# Patient Record
Sex: Female | Born: 1980 | Race: Black or African American | Hispanic: No | Marital: Single | Smoking: Current every day smoker
Health system: Southern US, Community
[De-identification: ages and names within clinical notes are randomized; demographics above are authoritative.]

## PROBLEM LIST (undated history)

## (undated) DIAGNOSIS — Z789 Other specified health status: Secondary | ICD-10-CM

## (undated) HISTORY — PX: TUBAL LIGATION: SHX77

## (undated) HISTORY — PX: KNEE SURGERY: SHX244

## (undated) HISTORY — PX: HAND SURGERY: SHX662

---

## 2005-11-30 ENCOUNTER — Emergency Department (HOSPITAL_COMMUNITY): Admission: EM | Admit: 2005-11-30 | Discharge: 2005-11-30 | Payer: Self-pay | Admitting: *Deleted

## 2005-12-19 ENCOUNTER — Emergency Department (HOSPITAL_COMMUNITY): Admission: EM | Admit: 2005-12-19 | Discharge: 2005-12-19 | Payer: Self-pay | Admitting: Emergency Medicine

## 2006-03-26 ENCOUNTER — Emergency Department (HOSPITAL_COMMUNITY): Admission: EM | Admit: 2006-03-26 | Discharge: 2006-03-26 | Payer: Self-pay | Admitting: Emergency Medicine

## 2006-08-01 ENCOUNTER — Emergency Department (HOSPITAL_COMMUNITY): Admission: EM | Admit: 2006-08-01 | Discharge: 2006-08-01 | Payer: Self-pay | Admitting: Emergency Medicine

## 2007-02-07 ENCOUNTER — Emergency Department (HOSPITAL_COMMUNITY): Admission: EM | Admit: 2007-02-07 | Discharge: 2007-02-07 | Payer: Self-pay | Admitting: Emergency Medicine

## 2007-07-01 ENCOUNTER — Emergency Department (HOSPITAL_COMMUNITY): Admission: EM | Admit: 2007-07-01 | Discharge: 2007-07-01 | Payer: Self-pay | Admitting: Emergency Medicine

## 2008-07-06 ENCOUNTER — Emergency Department (HOSPITAL_COMMUNITY): Admission: EM | Admit: 2008-07-06 | Discharge: 2008-07-06 | Payer: Self-pay | Admitting: Emergency Medicine

## 2008-07-17 ENCOUNTER — Emergency Department (HOSPITAL_COMMUNITY): Admission: EM | Admit: 2008-07-17 | Discharge: 2008-07-17 | Payer: Self-pay | Admitting: Emergency Medicine

## 2008-10-09 ENCOUNTER — Emergency Department (HOSPITAL_COMMUNITY): Admission: EM | Admit: 2008-10-09 | Discharge: 2008-10-09 | Payer: Self-pay | Admitting: Emergency Medicine

## 2009-10-16 ENCOUNTER — Emergency Department (HOSPITAL_COMMUNITY): Admission: EM | Admit: 2009-10-16 | Discharge: 2009-10-16 | Payer: Self-pay | Admitting: Emergency Medicine

## 2010-03-29 ENCOUNTER — Emergency Department (HOSPITAL_COMMUNITY)
Admission: EM | Admit: 2010-03-29 | Discharge: 2010-03-30 | Disposition: A | Discharge status: Home / Self Care | Payer: Medicaid Other | Attending: Emergency Medicine | Admitting: Emergency Medicine

## 2010-03-29 DIAGNOSIS — L03319 Cellulitis of trunk, unspecified: Secondary | ICD-10-CM | POA: Insufficient documentation

## 2010-03-29 DIAGNOSIS — L02219 Cutaneous abscess of trunk, unspecified: Secondary | ICD-10-CM | POA: Insufficient documentation

## 2010-04-01 ENCOUNTER — Emergency Department (HOSPITAL_COMMUNITY)
Admission: EM | Admit: 2010-04-01 | Discharge: 2010-04-01 | Disposition: A | Discharge status: Home / Self Care | Payer: Medicaid Other | Attending: Emergency Medicine | Admitting: Emergency Medicine

## 2010-04-01 DIAGNOSIS — L02219 Cutaneous abscess of trunk, unspecified: Secondary | ICD-10-CM | POA: Insufficient documentation

## 2010-04-01 DIAGNOSIS — L03319 Cellulitis of trunk, unspecified: Secondary | ICD-10-CM | POA: Insufficient documentation

## 2010-04-01 DIAGNOSIS — Z4800 Encounter for change or removal of nonsurgical wound dressing: Secondary | ICD-10-CM | POA: Insufficient documentation

## 2010-04-16 ENCOUNTER — Emergency Department (HOSPITAL_COMMUNITY)
Admission: EM | Admit: 2010-04-16 | Discharge: 2010-04-16 | Disposition: A | Discharge status: Home / Self Care | Payer: Medicaid Other | Attending: Emergency Medicine | Admitting: Emergency Medicine

## 2010-04-16 DIAGNOSIS — R070 Pain in throat: Secondary | ICD-10-CM | POA: Insufficient documentation

## 2010-04-16 DIAGNOSIS — R1310 Dysphagia, unspecified: Secondary | ICD-10-CM | POA: Insufficient documentation

## 2010-04-16 DIAGNOSIS — J020 Streptococcal pharyngitis: Secondary | ICD-10-CM | POA: Insufficient documentation

## 2010-04-16 DIAGNOSIS — R059 Cough, unspecified: Secondary | ICD-10-CM | POA: Insufficient documentation

## 2010-04-16 DIAGNOSIS — R05 Cough: Secondary | ICD-10-CM | POA: Insufficient documentation

## 2010-04-16 DIAGNOSIS — IMO0001 Reserved for inherently not codable concepts without codable children: Secondary | ICD-10-CM | POA: Insufficient documentation

## 2010-04-16 DIAGNOSIS — J351 Hypertrophy of tonsils: Secondary | ICD-10-CM | POA: Insufficient documentation

## 2010-04-16 LAB — RAPID STREP SCREEN (MED CTR MEBANE ONLY): Streptococcus, Group A Screen (Direct): POSITIVE — AB

## 2010-07-12 ENCOUNTER — Emergency Department (HOSPITAL_COMMUNITY)
Admission: EM | Admit: 2010-07-12 | Discharge: 2010-07-12 | Disposition: A | Discharge status: Home / Self Care | Payer: Medicaid Other | Attending: Emergency Medicine | Admitting: Emergency Medicine

## 2010-07-12 ENCOUNTER — Emergency Department (HOSPITAL_COMMUNITY): Payer: Medicaid Other

## 2010-07-12 DIAGNOSIS — X500XXA Overexertion from strenuous movement or load, initial encounter: Secondary | ICD-10-CM | POA: Insufficient documentation

## 2010-07-12 DIAGNOSIS — M545 Low back pain, unspecified: Secondary | ICD-10-CM | POA: Insufficient documentation

## 2010-07-12 DIAGNOSIS — S335XXA Sprain of ligaments of lumbar spine, initial encounter: Secondary | ICD-10-CM | POA: Insufficient documentation

## 2010-10-08 ENCOUNTER — Emergency Department (HOSPITAL_COMMUNITY)
Admission: EM | Admit: 2010-10-08 | Discharge: 2010-10-08 | Disposition: A | Discharge status: Home / Self Care | Payer: Medicaid Other | Attending: Emergency Medicine | Admitting: Emergency Medicine

## 2010-10-08 ENCOUNTER — Emergency Department (HOSPITAL_COMMUNITY): Payer: Medicaid Other

## 2010-10-08 DIAGNOSIS — X500XXA Overexertion from strenuous movement or load, initial encounter: Secondary | ICD-10-CM | POA: Insufficient documentation

## 2010-10-08 DIAGNOSIS — IMO0002 Reserved for concepts with insufficient information to code with codable children: Secondary | ICD-10-CM | POA: Insufficient documentation

## 2010-10-08 DIAGNOSIS — M79609 Pain in unspecified limb: Secondary | ICD-10-CM | POA: Insufficient documentation

## 2010-10-08 DIAGNOSIS — M25559 Pain in unspecified hip: Secondary | ICD-10-CM | POA: Insufficient documentation

## 2011-03-20 ENCOUNTER — Encounter (HOSPITAL_COMMUNITY): Payer: Self-pay | Admitting: Emergency Medicine

## 2011-03-20 ENCOUNTER — Emergency Department (HOSPITAL_COMMUNITY)
Admission: EM | Admit: 2011-03-20 | Discharge: 2011-03-20 | Disposition: A | Discharge status: Home / Self Care | Payer: Medicaid Other | Attending: Emergency Medicine | Admitting: Emergency Medicine

## 2011-03-20 DIAGNOSIS — H5710 Ocular pain, unspecified eye: Secondary | ICD-10-CM | POA: Insufficient documentation

## 2011-03-20 DIAGNOSIS — H579 Unspecified disorder of eye and adnexa: Secondary | ICD-10-CM | POA: Insufficient documentation

## 2011-03-20 DIAGNOSIS — H109 Unspecified conjunctivitis: Secondary | ICD-10-CM

## 2011-03-20 DIAGNOSIS — H5789 Other specified disorders of eye and adnexa: Secondary | ICD-10-CM | POA: Insufficient documentation

## 2011-03-20 MED ORDER — FLUORESCEIN SODIUM 1 MG OP STRP: 1.0000 | ORAL_STRIP | Freq: Once | OPHTHALMIC | Status: DC

## 2011-03-20 MED ORDER — TETRACAINE HCL 0.5 % OP SOLN
1.0000 [drp] | Freq: Once | OPHTHALMIC | Status: DC
  Filled 2011-03-20: qty 2

## 2011-03-20 MED ORDER — NAPHAZOLINE-PHENIRAMINE 0.025-0.3 % OP SOLN: 1.0000 [drp] | OPHTHALMIC | Status: AC | PRN

## 2011-03-20 NOTE — ED Provider Notes (Signed)
Medical screening examination/treatment/procedure(s) were performed by non-physician practitioner and as supervising physician I was immediately available for consultation/collaboration.  Ethelda Chick, MD 03/20/11 1053

## 2011-03-20 NOTE — ED Provider Notes (Signed)
History     CSN: [id number]  Arrival date & time 03/20/11  5621   First MD Initiated Contact with Patient 03/20/11 0920      Chief Complaint  Patient presents with  . Eye Pain    (Consider location/radiation/quality/duration/timing/severity/associated sxs/prior treatment) HPI Comments: Patient presents emergency department with chief complaint of right eye your dictation.  Patient states that it has been itching and tearing for the last 2 days out of the right eye.  States she has a sensation of foreign body. She denies blurred vision, diplopia, pain with extraocular movements, thick purulent discharge, headaches, photophobia, recent sickness, fevers, night sweats, chills.  Patient currently has no other complaints.  Patient is a 31 y.o. female presenting with eye pain. The history is provided by the patient.  Eye Pain Pertinent negatives include no abdominal pain, chest pain, chills, congestion, fever, headaches, neck pain, numbness or weakness.    History reviewed. No pertinent past medical history.  History reviewed. No pertinent past surgical history.  History reviewed. No pertinent family history.  History  Substance Use Topics  . Smoking status: Current Everyday Smoker  . Smokeless tobacco: Not on file  . Alcohol Use: Yes     occasional    OB History    Grav Para Term Preterm Abortions TAB SAB Ect Mult Living                  Review of Systems  Constitutional: Negative for fever, chills and appetite change.  HENT: Negative for congestion, neck pain and neck stiffness.   Eyes: Positive for redness and itching. Negative for photophobia, pain and visual disturbance.  Respiratory: Negative for shortness of breath.   Cardiovascular: Negative for chest pain and leg swelling.  Gastrointestinal: Negative for abdominal pain.  Genitourinary: Negative for dysuria, urgency and frequency.  Neurological: Negative for dizziness, syncope, weakness, light-headedness, numbness  and headaches.  Psychiatric/Behavioral: Negative for confusion.  All other systems reviewed and are negative.    Allergies  Review of patient's allergies indicates no known allergies.  Home Medications  No current outpatient prescriptions on file.  BP 123/66  Pulse 92  Temp(Src) 98.2 F (36.8 C) (Oral)  Resp 18  SpO2 99%  Physical Exam  Nursing note and vitals reviewed. Constitutional: She is oriented to person, place, and time. She appears well-developed and well-nourished. No distress.  HENT:  Head: Normocephalic and atraumatic.  Eyes: Conjunctivae and EOM are normal. Pupils are equal, round, and reactive to light. No foreign body present in the left eye.       No tenderness to palpation over temporal arteries or orbital region. Pain free EOMs, visual acuity equal bilaterally, no increase in IOPs, no proptosis, lid swelling, hyphema, purulent discharge from eyes, or consensual photophobia.  Eyelids everted, no evidence of FB.  Fluorescein study: no corneal uptake, dendritic pattern, or evidence of corneal abrasions.  Neck: Normal range of motion. Neck supple.  Pulmonary/Chest: Effort normal.  Neurological: She is alert and oriented to person, place, and time.  Skin: Skin is warm and dry. No rash noted. She is not diaphoretic.  Psychiatric: Her behavior is normal.    ED Course  Procedures (including critical care time)  Labs Reviewed - No data to display No results found.   No diagnosis found.    MDM  Viral conjunctivitis  Pt dx likely viral conjunctivitis based on presentation & eye exam, no antibiotics are indicated.  No evidence of HSV or VSV infection. Pt is not a  contact lens wearer.  Exam non-concerning for orbital cellulitis, hyphema, corneal ulcers, corneal abrasions or trauma.  Patient will be discharged home with naphazoline drops every one to 2 hours for pruritus.  Patient has been instructed to use cool compresses and practice personal hygiene with  frequent handwashing.  Patient understands to followup with ophthalmology, especially if new symptoms including change in vision, purulent drainage, or entrapment occur.          Jaci Carrel, [place] 03/20/11 1016

## 2011-03-20 NOTE — ED Notes (Signed)
Patient states that she has pain in her right eye with redness and itching x 2 days and states the left eye is starting to itch in the outer corner of the eye. Right eye does appear to be red and irritated.

## 2011-03-20 NOTE — ED Notes (Signed)
Pt c/o right eye redness, itching and watering x 2 days

## 2011-05-01 ENCOUNTER — Emergency Department (HOSPITAL_COMMUNITY)
Admission: EM | Admit: 2011-05-01 | Discharge: 2011-05-01 | Disposition: A | Discharge status: Home / Self Care | Payer: Medicaid Other | Attending: Emergency Medicine | Admitting: Emergency Medicine

## 2011-05-01 ENCOUNTER — Encounter (HOSPITAL_COMMUNITY): Payer: Self-pay | Admitting: *Deleted

## 2011-05-01 DIAGNOSIS — F17200 Nicotine dependence, unspecified, uncomplicated: Secondary | ICD-10-CM | POA: Insufficient documentation

## 2011-05-01 DIAGNOSIS — J040 Acute laryngitis: Secondary | ICD-10-CM | POA: Insufficient documentation

## 2011-05-01 MED ORDER — DEXAMETHASONE 6 MG PO TABS
10.0000 mg | ORAL_TABLET | ORAL | Status: AC
  Administered 2011-05-01: 10 mg via ORAL
  Filled 2011-05-01: qty 1

## 2011-05-01 MED ORDER — DEXAMETHASONE SODIUM PHOSPHATE 10 MG/ML IJ SOLN
INTRAMUSCULAR | Status: AC
  Filled 2011-05-01: qty 1

## 2011-05-01 NOTE — ED Provider Notes (Signed)
History     CSN: [id number]  Arrival date & time 05/01/11  1742   None     Chief Complaint  Patient presents with  . Sore Throat    (Consider location/radiation/quality/duration/timing/severity/associated sxs/prior treatment) HPI Cc hoarse throat, no sore throat. Onset this am, aggravated with talking.  No other sx's History reviewed. No pertinent past medical history.  History reviewed. No pertinent past surgical history.  No family history on file.  History  Substance Use Topics  . Smoking status: Current Everyday Smoker  . Smokeless tobacco: Not on file  . Alcohol Use: Yes     occasional    OB History    Grav Para Term Preterm Abortions TAB SAB Ect Mult Living                  Review of Systems  Constitutional: Negative for fever.  HENT: Positive for voice change. Negative for sore throat and trouble swallowing.   Respiratory: Negative for shortness of breath.   All other systems reviewed and are negative.    Allergies  Review of patient's allergies indicates no known allergies.  Home Medications  No current outpatient prescriptions on file.  BP 103/72  Pulse 98  Temp(Src) 97.4 F (36.3 C) (Oral)  Resp 18  SpO2 98%ra wnl  Physical Exam  Nursing note and vitals reviewed. Constitutional: She appears well-developed and well-nourished.  HENT:  Head: Normocephalic and atraumatic.  Mouth/Throat: Uvula is midline, oropharynx is clear and moist and mucous membranes are normal.  Eyes: Right eye exhibits no discharge. Left eye exhibits no discharge.  Neck: Normal range of motion. Neck supple.  Cardiovascular: Normal rate, regular rhythm and normal heart sounds.   Pulmonary/Chest: Effort normal and breath sounds normal.  Abdominal: Soft. There is no tenderness.  Lymphadenopathy:    She has no cervical adenopathy.  Skin: Skin is warm and dry.    ED Course  Procedures (including critical care time)   Labs Reviewed  RAPID STREP SCREEN   No results  found.   1. Laryngitis acute       MDM  Pt is in nad, afvss, nontoxic appearing, exam and hx as above. Oropharynx clear, no pain.  Likely simple laryngitis.  No cp, doubt aortic aneurysm in young healthy person without hx of medical problems.        Elijio Miles, MD 05/01/11 [phone]

## 2011-05-01 NOTE — ED Notes (Signed)
Pt angry at the wait time.  Reassured.  Pt refused BP-states that it hurts her hand when it squeezes.

## 2011-05-01 NOTE — ED Provider Notes (Signed)
31 year old female had onset day of losing her voice. She has had some nasal congestion with some slight postnasal drainage and cough productive of sputum which she swallows. She denies fever or chills. On exam, there is minimal tenderness to palpation over the left maxillary sinus. Some slight mucoid drainage is noted on the turbinates which does not appear purulent. Thanks is mildly erythematous. There is no cervical adenopathy. She has no difficulty with her secretions. She will be treated with oral steroids but advised to return if symptoms worsen. She is advised to stop smoking.  Dione Booze, MD 05/01/11 2231

## 2011-05-01 NOTE — Discharge Instructions (Signed)
Laryngitis At the top of your windpipe is your voice box. It is the source of your voice. Inside your voice box are 2 bands of muscles called vocal cords. When you breathe, your vocal cords are relaxed and open so that air can get into the lungs. When you decide to say something, these cords come together and vibrate. The sound from these vibrations goes into your throat and comes out through your mouth as sound. Laryngitis is an inflammation of the vocal cords that causes hoarseness, cough, loss of voice, sore throat, and dry throat. Laryngitis can be temporary (acute) or long-term (chronic). Most cases of acute laryngitis improve with time.Chronic laryngitis lasts for more than 3 weeks. CAUSES Laryngitis can often be related to excessive smoking, talking, or yelling, as well as inhalation of toxic fumes and allergies. Acute laryngitis is usually caused by a viral infection, vocal strain, measles or mumps, or bacterial infections. Chronic laryngitis is usually caused by vocal cord strain, vocal cord injury, postnasal drip, growths on the vocal cords, or acid reflux. SYMPTOMS   Cough.   Sore throat.   Dry throat.  RISK FACTORS  Respiratory infections.   Exposure to irritating substances, such as cigarette smoke, excessive amounts of alcohol, stomach acids, and workplace chemicals.   Voice trauma, such as vocal cord injury from shouting or speaking too loud.  DIAGNOSIS  Your cargiver will perform a physical exam. During the physical exam, your caregiver will examine your throat. The most common sign of laryngitis is hoarseness. Laryngoscopy may be necessary to confirm the diagnosis of this condition. This procedure allows your caregiver to look into the larynx. HOME CARE INSTRUCTIONS  Drink enough fluids to keep your urine clear or pale yellow.   Rest until you no longer have symptoms or as directed by your caregiver.   Breathe in moist air.   Take all medicine as directed by your  caregiver.   Do not smoke.   Talk as little as possible (this includes whispering).   Write on paper instead of talking until your voice is back to normal.   Follow up with your caregiver if your condition has not improved after 10 days.  SEEK MEDICAL CARE IF:   You have trouble breathing.   You cough up blood.   You have persistent fever.   You have increasing pain.   You have difficulty swallowing.  MAKE SURE YOU:  Understand these instructions.   Will watch your condition.   Will get help right away if you are not doing well or get worse.  Document Released: 01/27/2005 Document Revised: 01/16/2011 Document Reviewed: 04/04/2010 [facility] Patient Information 2012 [place], [place].  Return for any new or worsening symptoms or any other concerns.

## 2011-05-01 NOTE — ED Notes (Signed)
Pt woke up this am and had lost her voice.  No previous illness, states that she has had some coughing this am and states that her throat is a little sore when she whispers a lot and after coughing.  No fever or chills

## 2011-05-07 NOTE — ED Provider Notes (Signed)
I saw and evaluated the patient, reviewed the resident's note and I agree with the findings and plan.   Dione Booze, MD 05/07/11 [phone]

## 2011-07-19 ENCOUNTER — Emergency Department (HOSPITAL_COMMUNITY)
Admission: EM | Admit: 2011-07-19 | Discharge: 2011-07-19 | Disposition: A | Discharge status: Home / Self Care | Payer: Medicaid Other | Attending: Emergency Medicine | Admitting: Emergency Medicine

## 2011-07-19 ENCOUNTER — Encounter (HOSPITAL_COMMUNITY): Payer: Self-pay | Admitting: Emergency Medicine

## 2011-07-19 DIAGNOSIS — H109 Unspecified conjunctivitis: Secondary | ICD-10-CM | POA: Insufficient documentation

## 2011-07-19 DIAGNOSIS — F17200 Nicotine dependence, unspecified, uncomplicated: Secondary | ICD-10-CM | POA: Insufficient documentation

## 2011-07-19 MED ORDER — TOBRAMYCIN 0.3 % OP SOLN
2.0000 [drp] | Freq: Once | OPHTHALMIC | Status: AC
  Administered 2011-07-19: 2 [drp] via OPHTHALMIC
  Filled 2011-07-19: qty 5

## 2011-07-19 NOTE — Discharge Instructions (Signed)
RECOMMEND USE OF CLARITIN OR ZYRTEC DAILY FOR THE NEXT WEEK TO HELP WITH ITCHING AND ANY ALLERGIC COMPONENT TO EYE CONJUNCTIVITIS. USE TOBREX 3 TIMES DAILY (2-3 DROPS) FOR 5 DAYS. FOLLOW UP WITH DR. RANKIN IF SYMPTOMS WORSEN OR DO NOT IMPROVE OVER THE NEXT SEVERAL DAYS.  Conjunctivitis Conjunctivitis is commonly called "pink eye." Conjunctivitis can be caused by bacterial or viral infection, allergies, or injuries. There is usually redness of the lining of the eye, itching, discomfort, and sometimes discharge. There may be deposits of matter along the eyelids. A viral infection usually causes a watery discharge, while a bacterial infection causes a yellowish, thick discharge. Pink eye is very contagious and spreads by direct contact. You may be given antibiotic eyedrops as part of your treatment. Before using your eye medicine, remove all drainage from the eye by washing gently with warm water and cotton balls. Continue to use the medication until you have awakened 2 mornings in a row without discharge from the eye. Do not rub your eye. This increases the irritation and helps spread infection. Use separate towels from other household members. Wash your hands with soap and water before and after touching your eyes. Use cold compresses to reduce pain and sunglasses to relieve irritation from light. Do not wear contact lenses or wear eye makeup until the infection is gone. SEEK MEDICAL CARE IF:   Your symptoms are not better after 3 days of treatment.   You have increased pain or trouble seeing.   The outer eyelids become very red or swollen.  Document Released: 03/06/2004 Document Revised: 01/16/2011 Document Reviewed: 01/27/2005 [facility] Patient Information 2012 [place], [place].

## 2011-07-19 NOTE — ED Notes (Signed)
Pt. Stated, i woke up this am and my lt eye drainage and itching.

## 2011-07-19 NOTE — ED Provider Notes (Signed)
Medical screening examination/treatment/procedure(s) were performed by non-physician practitioner and as supervising physician I was immediately available for consultation/collaboration.   Loren Racer, MD 07/19/11 1059

## 2011-07-19 NOTE — ED Provider Notes (Signed)
History     CSN: [id number]  Arrival date & time 07/19/11  4098   First MD Initiated Contact with Patient 07/19/11 [phone]      Chief Complaint  Patient presents with  . Eye Problem    (Consider location/radiation/quality/duration/timing/severity/associated sxs/prior treatment) Patient is a 31 y.o. female presenting with eye problem. The history is provided by the patient.  Eye Problem  This is a new problem. The current episode started 1 to 2 hours ago. The problem occurs constantly. There is pain in the left eye. There was no injury mechanism. Associated symptoms include discharge and eye redness. Associated symptoms comments: She woke this morning with left eye matted shut. She rubbed it to get it open and has persistent itching and irritation of the eye, swelling of lateral lower lid and a burning sensation to this part of the lid. Marland Kitchen    History reviewed. No pertinent past medical history.  History reviewed. No pertinent past surgical history.  No family history on file.  History  Substance Use Topics  . Smoking status: Current Everyday Smoker  . Smokeless tobacco: Not on file  . Alcohol Use: Yes     occasional    OB History    Grav Para Term Preterm Abortions TAB SAB Ect Mult Living                  Review of Systems  Constitutional: Negative for fever.  HENT: Negative for congestion.   Eyes: Positive for discharge, redness and itching. Negative for visual disturbance.  Respiratory: Negative for cough.     Allergies  Review of patient's allergies indicates no known allergies.  Home Medications   Current Outpatient Rx  Name Route Sig Dispense Refill  . CYCLOBENZAPRINE HCL 5 MG PO TABS Oral Take 5 mg by mouth 3 (three) times daily as needed. For muscle spasms    . HYDROCORTISONE 1 % EX CREA Topical Apply 1 application topically 3 (three) times daily as needed. For itching      BP 130/47  Pulse 79  Temp(Src) 98.4 F (36.9 C) (Oral)  Resp 20  SpO2 98%  LMP  07/01/2011  Physical Exam  Constitutional: She appears well-developed and well-nourished. No distress.  HENT:  Mouth/Throat: Oropharynx is clear and moist.  Eyes:       Left eye has mild conjunctival injection. No purulent discharge. Lower lateral lid has mild swelling without redness, that is dry. No distinct tenderness. No facial tenderness.  Neck: Normal range of motion.  Pulmonary/Chest: Effort normal.  Lymphadenopathy:    She has no cervical adenopathy.    ED Course  Procedures (including critical care time)  Labs Reviewed - No data to display No results found.   No diagnosis found.  1. Conjunctivitis   MDM  Suspect allergic conjunctivitis over bacterial. Still, will cover with tobrex and refer to opthamology for persistent symptoms.        Rodena Medin, PA-C 07/19/11 [phone]

## 2011-10-10 ENCOUNTER — Encounter (HOSPITAL_COMMUNITY): Payer: Self-pay | Admitting: Family Medicine

## 2011-10-10 ENCOUNTER — Emergency Department (HOSPITAL_COMMUNITY)
Admission: EM | Admit: 2011-10-10 | Discharge: 2011-10-10 | Disposition: A | Discharge status: Home / Self Care | Payer: Medicaid Other | Attending: Emergency Medicine | Admitting: Emergency Medicine

## 2011-10-10 DIAGNOSIS — M549 Dorsalgia, unspecified: Secondary | ICD-10-CM

## 2011-10-10 DIAGNOSIS — F17200 Nicotine dependence, unspecified, uncomplicated: Secondary | ICD-10-CM | POA: Insufficient documentation

## 2011-10-10 DIAGNOSIS — M545 Low back pain, unspecified: Secondary | ICD-10-CM | POA: Insufficient documentation

## 2011-10-10 DIAGNOSIS — R2991 Unspecified symptoms and signs involving the musculoskeletal system: Secondary | ICD-10-CM

## 2011-10-10 LAB — URINALYSIS, ROUTINE W REFLEX MICROSCOPIC
Bilirubin Urine: NEGATIVE
Hgb urine dipstick: NEGATIVE
Ketones, ur: NEGATIVE mg/dL
Nitrite: NEGATIVE
Protein, ur: NEGATIVE mg/dL
Specific Gravity, Urine: 1.027 (ref 1.005–1.030)
Urobilinogen, UA: 0.2 mg/dL (ref 0.0–1.0)
pH: 6 (ref 5.0–8.0)

## 2011-10-10 LAB — POCT PREGNANCY, URINE: Preg Test, Ur: NEGATIVE

## 2011-10-10 LAB — URINE MICROSCOPIC-ADD ON

## 2011-10-10 MED ORDER — DIAZEPAM 5 MG/ML IJ SOLN
5.0000 mg | Freq: Once | INTRAMUSCULAR | Status: AC
  Administered 2011-10-10: 10 mg via INTRAMUSCULAR
  Filled 2011-10-10: qty 2

## 2011-10-10 MED ORDER — KETOROLAC TROMETHAMINE 60 MG/2ML IM SOLN
60.0000 mg | Freq: Once | INTRAMUSCULAR | Status: AC
  Administered 2011-10-10: 60 mg via INTRAMUSCULAR
  Filled 2011-10-10: qty 2

## 2011-10-10 MED ORDER — METHOCARBAMOL 500 MG PO TABS: ORAL_TABLET | ORAL | Status: DC

## 2011-10-10 NOTE — ED Notes (Signed)
Per pt sts right side back pain that started this am when she woke up. Denies any injury. Denies any urinary problems. Denies N,V,D.

## 2011-10-10 NOTE — ED Provider Notes (Cosign Needed)
History   This chart was scribed for No att. providers found by Toya Smothers. The patient was seen in room TR05C/TR05C. Patient's care was started at 1143.  CSN: [id number]  Arrival date & time 10/10/11  1143   First MD Initiated Contact with Patient 10/10/11 1313      Chief Complaint  Patient presents with  . Back Pain   The history is provided by the patient. No language interpreter was used.    Kari Porter is a 31 y.o. female presents to the ED complaining of moderate R lower back pain onset 1 day ago after heavy lifting earlier this week. Typically healthy at baseline lower back pain represents a significant deviation, and pain has worsened acutely over the past day. Pt endorses associate cough onset yesterday. Pain is described dissimilar to muscle spasms as a constant, moderate, and shooting soreness. Aggravated with movement. Alleviated with rest. Pt list a PMFSH and denies fever, chills, numbness, and sore throat. medical history of kidney stones paternally  PT relates she packed large laundry bags of towels 4 days ago. States she started having pain in her right lower back last night. Has no pain if she sits still but any movement of arm, legs, trunk or coughing or sneezing makes it hurt more. She had cough that started yesterday but denies fever, SOB, Nausea, vomiting, sore throat, fever, frequency, dysuria. Hematuria. Has never has this before but gets muscles spasms.   PCP Dr Concepcion Elk  History reviewed. No pertinent past medical history.  History reviewed. No pertinent past surgical history.  History reviewed. No pertinent family history.  History  Substance Use Topics  . Smoking status: Current Everyday Smoker  . Smokeless tobacco: Not on file  . 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  Genitourinary: Negative for dysuria, frequency and hematuria.  Musculoskeletal: Positive for  back pain.  All other systems reviewed and are negative.    Allergies  Review of patient's allergies indicates no known allergies.  Home Medications   Current Outpatient Rx  Name Route Sig Dispense Refill  . ASPIRIN 325 MG PO TABS Oral Take 325 mg by mouth daily.    . CYCLOBENZAPRINE HCL 5 MG PO TABS Oral Take 5 mg by mouth 3 (three) times daily as needed. For muscle spasms    . PSEUDOEPH-DOXYLAMINE-DM-APAP 60-7.07-09-998 MG/30ML PO LIQD Oral Take 30 mLs by mouth every 4 (four) hours as needed. For cold      ED Triage Vitals  Enc Vitals Group     BP 10/10/11 1525 117/82 mmHg     Pulse Rate 10/10/11 1525 62      Resp 10/10/11 1525 16      Temp 10/10/11 1525 98.2 F (36.8 C)     Temp src 10/10/11 1525 Oral     SpO2 10/10/11 1525 99 %     Weight --      Height --      Head Cir --      Peak Flow --      Pain Score 10/10/11 1153 Seven     Pain Loc --      Pain Edu? --      Excl. in GC? --    Vital signs normal     Physical Exam  Constitutional: She appears well-developed and well-nourished. No distress.  HENT:  Head: Normocephalic and atraumatic.  Right Ear: External ear normal.  Left Ear: External ear normal.  Mouth/Throat: Oropharynx is clear and moist. No oropharyngeal exudate.  Eyes: Conjunctivae and EOM are normal. Pupils are equal, round, and reactive to light. Right eye exhibits no discharge. Left eye exhibits no discharge.  Neck: Normal range of motion. Neck supple.  Cardiovascular: Normal rate, regular rhythm and normal heart sounds.   No murmur heard. Pulmonary/Chest: Effort normal and breath sounds normal. No respiratory distress. She has no wheezes. She has no rales.  Musculoskeletal: Normal range of motion.       Thoracic and Lumbar non-tender. Tenderness of the R perispinous muscle of the Lumbar spine more-so on the right side. SIJ seems to reproduce pain. Very painful with any ROM, change of position or movement.     ED Course  Procedures (including  critical care time)   Medications  ketorolac (TORADOL) injection 60 mg (60 mg Intramuscular Given 10/10/11 1401)  diazepam (VALIUM) injection 5 mg (10 mg Intramuscular Given 10/10/11 1401)    COORDINATION OF CARE: 1339- Evaluated Pt. Pt is awake, alert, and oriented. 1502- Pt feels improved after observation and treatment in ED. Will prepare for discharge. Pt ambulatory and is moving freely without difficulty, getting upset because she wants to go to eat.    Results for orders placed during the hospital encounter of 10/10/11  URINALYSIS, ROUTINE W REFLEX MICROSCOPIC      Component Value Range   Color, Urine YELLOW  YELLOW   APPearance HAZY (*) CLEAR   Specific Gravity, Urine 1.027  1.005 - 1.030   pH 6.0  5.0 - 8.0   Glucose, UA NEGATIVE  NEGATIVE mg/dL   Hgb urine dipstick NEGATIVE  NEGATIVE   Bilirubin Urine NEGATIVE  NEGATIVE   Ketones, ur NEGATIVE  NEGATIVE mg/dL   Protein, ur NEGATIVE  NEGATIVE mg/dL   Urobilinogen, UA 0.2  0.0 - 1.0 mg/dL   Nitrite NEGATIVE  NEGATIVE   Leukocytes, UA TRACE (*) NEGATIVE  POCT PREGNANCY, URINE      Component Value Range   Preg Test, Ur NEGATIVE  NEGATIVE  URINE MICROSCOPIC-ADD ON      Component Value Range   Squamous Epithelial / LPF MANY (*) RARE   WBC, UA 0-2  <3 WBC/hpf   RBC / HPF 0-2  <3 RBC/hpf   Bacteria, UA RARE  RARE   Laboratory interpretation all normal      1. Back pain   2. Musculoskeletal abnormal finding on examination     New Prescriptions   METHOCARBAMOL (ROBAXIN) 500 MG TABLET    Take 1 or 2 po Q 6hrs for muscle pain  ibuprofen 600 mg QID  Plan discharge  Devoria Albe, MD, FACEP   MDM   I personally performed the services described in this documentation, which was scribed in my presence. The recorded information has been reviewed and considered.  Devoria Albe, MD, Armando Gang       Ward Givens, MD 10/10/11 [phone]

## 2012-02-04 ENCOUNTER — Emergency Department (HOSPITAL_COMMUNITY)
Admission: EM | Admit: 2012-02-04 | Discharge: 2012-02-04 | Disposition: A | Discharge status: Home / Self Care | Payer: Self-pay | Attending: Emergency Medicine | Admitting: Emergency Medicine

## 2012-02-04 ENCOUNTER — Encounter (HOSPITAL_COMMUNITY): Payer: Self-pay | Admitting: *Deleted

## 2012-02-04 DIAGNOSIS — R109 Unspecified abdominal pain: Secondary | ICD-10-CM | POA: Insufficient documentation

## 2012-02-04 DIAGNOSIS — Z7982 Long term (current) use of aspirin: Secondary | ICD-10-CM | POA: Insufficient documentation

## 2012-02-04 DIAGNOSIS — F17200 Nicotine dependence, unspecified, uncomplicated: Secondary | ICD-10-CM | POA: Insufficient documentation

## 2012-02-04 DIAGNOSIS — R112 Nausea with vomiting, unspecified: Secondary | ICD-10-CM | POA: Insufficient documentation

## 2012-02-04 DIAGNOSIS — Z3202 Encounter for pregnancy test, result negative: Secondary | ICD-10-CM | POA: Insufficient documentation

## 2012-02-04 DIAGNOSIS — R197 Diarrhea, unspecified: Secondary | ICD-10-CM | POA: Insufficient documentation

## 2012-02-04 LAB — URINALYSIS, ROUTINE W REFLEX MICROSCOPIC
Glucose, UA: NEGATIVE mg/dL
Leukocytes, UA: NEGATIVE
Specific Gravity, Urine: 1.037 — ABNORMAL HIGH (ref 1.005–1.030)
Urobilinogen, UA: 0.2 mg/dL (ref 0.0–1.0)
pH: 6 (ref 5.0–8.0)

## 2012-02-04 LAB — COMPREHENSIVE METABOLIC PANEL
Alkaline Phosphatase: 63 U/L (ref 39–117)
CO2: 23 mEq/L (ref 19–32)
Calcium: 9 mg/dL (ref 8.4–10.5)
Creatinine, Ser: 0.88 mg/dL (ref 0.50–1.10)
Potassium: 3.3 mEq/L — ABNORMAL LOW (ref 3.5–5.1)
Total Bilirubin: 0.3 mg/dL (ref 0.3–1.2)

## 2012-02-04 LAB — CBC WITH DIFFERENTIAL/PLATELET
Basophils Relative: 0 % (ref 0–1)
Eosinophils Absolute: 0.1 10*3/uL (ref 0.0–0.7)
HCT: 36.4 % (ref 36.0–46.0)
Hemoglobin: 12.2 g/dL (ref 12.0–15.0)
MCV: 76.6 fL — ABNORMAL LOW (ref 78.0–100.0)
Monocytes Relative: 8 % (ref 3–12)
Neutrophils Relative %: 75 % (ref 43–77)
Platelets: 290 10*3/uL (ref 150–400)
WBC: 10.9 10*3/uL — ABNORMAL HIGH (ref 4.0–10.5)

## 2012-02-04 LAB — LIPASE, BLOOD: Lipase: 25 U/L (ref 11–59)

## 2012-02-04 LAB — URINE MICROSCOPIC-ADD ON

## 2012-02-04 LAB — PREGNANCY, URINE: Preg Test, Ur: NEGATIVE

## 2012-02-04 MED ORDER — ONDANSETRON HCL 4 MG/2ML IJ SOLN
4.0000 mg | Freq: Once | INTRAMUSCULAR | Status: AC
  Administered 2012-02-04: 4 mg via INTRAVENOUS
  Filled 2012-02-04: qty 2

## 2012-02-04 MED ORDER — PROMETHAZINE HCL 25 MG PO TABS: 25.0000 mg | ORAL_TABLET | Freq: Four times a day (QID) | ORAL | Status: DC | PRN

## 2012-02-04 MED ORDER — MORPHINE SULFATE 4 MG/ML IJ SOLN
4.0000 mg | Freq: Once | INTRAMUSCULAR | Status: AC
  Administered 2012-02-04: 4 mg via INTRAVENOUS
  Filled 2012-02-04: qty 1

## 2012-02-04 MED ORDER — SODIUM CHLORIDE 0.9 % IV BOLUS (SEPSIS)
1000.0000 mL | Freq: Once | INTRAVENOUS | Status: AC
  Administered 2012-02-04: 1000 mL via INTRAVENOUS

## 2012-02-04 NOTE — ED Notes (Signed)
C/o avg diarrhea x 4 daily x 2 weeks. N/v started today. Mid upper abd pain intermittent. Also abd distention & bloating. Abd soft & tender to palpation. Reports unable to keep anything down even water & noticed blood in emesis 2 weeks ago.

## 2012-02-04 NOTE — ED Provider Notes (Signed)
History     CSN: [id number]  Arrival date & time 02/04/12  4098   First MD Initiated Contact with Patient 02/04/12 0754      No chief complaint on file.   (Consider location/radiation/quality/duration/timing/severity/associated sxs/prior treatment) HPI  31 year old female presents c/o abd pain.  Pt reports for the past 2 weeks she has had intermittent sharp achy pain to her mid abdomen that can last from seconds to minutes.  She has had 3-4 bouts of non bloody non mucous diarrhea per day.  Last night she has her abdominal pain and felt nauseous.  She vomited twice of non bloody, non bilious vomit and decided to come to ER for further evaluation.  Onset gradual, intermittent, mild-moderate in severity, non radiating, no treatment tried.  Denies fever but endorse chills.  Denies headache, sore throat, sneeze, cough, cp, sob, back pain, dysuria or rash.  No recent sick contact.  No abx use.  Has appetite.    No past medical history on file.  No past surgical history on file.  No family history on file.  History  Substance Use Topics  . Smoking status: Current Every Day Smoker  . Smokeless tobacco: Not on file  . Alcohol Use: Yes     Comment: occasional    OB History    Grav Para Term Preterm Abortions TAB SAB Ect Mult Living                  Review of Systems  All other systems reviewed and are negative.    Allergies  Review of patient's allergies indicates no known allergies.  Home Medications   Current Outpatient Rx  Name  Route  Sig  Dispense  Refill  . ASPIRIN 325 MG PO TABS   Oral   Take 325 mg by mouth daily.         . CYCLOBENZAPRINE HCL 5 MG PO TABS   Oral   Take 5 mg by mouth 3 (three) times daily as needed. For muscle spasms         . METHOCARBAMOL 500 MG PO TABS      Take 1 or 2 po Q 6hrs for muscle pain   60 tablet   0   . PSEUDOEPH-DOXYLAMINE-DM-APAP 60-7.07-09-998 MG/30ML PO LIQD   Oral   Take 30 mLs by mouth every 4 (four) hours as  needed. For cold           There were no vitals taken for this visit.  Physical Exam  Nursing note and vitals reviewed. Constitutional: She is oriented to person, place, and time. She appears well-developed and well-nourished. No distress.       Awake, alert, nontoxic appearance  HENT:  Head: Atraumatic.  Eyes: Conjunctivae normal are normal. Right eye exhibits no discharge. Left eye exhibits no discharge.  Neck: Neck supple.  Cardiovascular: Normal rate and regular rhythm.   Pulmonary/Chest: Effort normal. No respiratory distress. She exhibits no tenderness.  Abdominal: Soft. She exhibits no distension. There is tenderness (abdomen is soft, with mild tenderness diffusely, non focal, no guarding or rebound.  No hernia, no overlying skin changes. normal BS). There is no rebound.       Negative Murphy sign, no McBurney's point  Musculoskeletal: She exhibits no tenderness.       ROM appears intact, no obvious focal weakness  Neurological: She is alert and oriented to person, place, and time.       Mental status and motor strength appears intact  Skin: No rash noted.  Psychiatric: She has a normal mood and affect.    ED Course  Procedures (including critical care time)  Results for orders placed during the hospital encounter of 02/04/12  CBC WITH DIFFERENTIAL      Component Value Range   WBC 10.9 (*) 4.0 - 10.5 K/uL   RBC 4.75  3.87 - 5.11 MIL/uL   Hemoglobin 12.2  12.0 - 15.0 g/dL   HCT 11.9  14.7 - 82.9 %   MCV 76.6 (*) 78.0 - 100.0 fL   MCH 25.7 (*) 26.0 - 34.0 pg   MCHC 33.5  30.0 - 36.0 g/dL   RDW 56.2  13.0 - 86.5 %   Platelets 290  150 - 400 K/uL   Neutrophils Relative 75  43 - 77 %   Lymphocytes Relative 16  12 - 46 %   Monocytes Relative 8  3 - 12 %   Eosinophils Relative 1  0 - 5 %   Basophils Relative 0  0 - 1 %   Neutro Abs 8.2 (*) 1.7 - 7.7 K/uL   Lymphs Abs 1.7  0.7 - 4.0 K/uL   Monocytes Absolute 0.9  0.1 - 1.0 K/uL   Eosinophils Absolute 0.1  0.0 - 0.7  K/uL   Basophils Absolute 0.0  0.0 - 0.1 K/uL   RBC Morphology TARGET CELLS    COMPREHENSIVE METABOLIC PANEL      Component Value Range   Sodium 137  135 - 145 mEq/L   Potassium 3.3 (*) 3.5 - 5.1 mEq/L   Chloride 102  96 - 112 mEq/L   CO2 23  19 - 32 mEq/L   Glucose, Bld 86  70 - 99 mg/dL   BUN 10  6 - 23 mg/dL   Creatinine, Ser 7.84  0.50 - 1.10 mg/dL   Calcium 9.0  8.4 - 69.6 mg/dL   Total Protein 6.9  6.0 - 8.3 g/dL   Albumin 3.9  3.5 - 5.2 g/dL   AST 20  0 - 37 U/L   ALT 13  0 - 35 U/L   Alkaline Phosphatase 63  39 - 117 U/L   Total Bilirubin 0.3  0.3 - 1.2 mg/dL   GFR calc non Af Amer 85 (*) >90 mL/min   GFR calc Af Amer >90  >90 mL/min  LIPASE, BLOOD      Component Value Range   Lipase 25  11 - 59 U/L  URINALYSIS, ROUTINE W REFLEX MICROSCOPIC      Component Value Range   Color, Urine YELLOW  YELLOW   APPearance CLOUDY (*) CLEAR   Specific Gravity, Urine 1.037 (*) 1.005 - 1.030   pH 6.0  5.0 - 8.0   Glucose, UA NEGATIVE  NEGATIVE mg/dL   Hgb urine dipstick SMALL (*) NEGATIVE   Bilirubin Urine SMALL (*) NEGATIVE   Ketones, ur 40 (*) NEGATIVE mg/dL   Protein, ur 30 (*) NEGATIVE mg/dL   Urobilinogen, UA 0.2  0.0 - 1.0 mg/dL   Nitrite NEGATIVE  NEGATIVE   Leukocytes, UA NEGATIVE  NEGATIVE  PREGNANCY, URINE      Component Value Range   Preg Test, Ur NEGATIVE  NEGATIVE  URINE MICROSCOPIC-ADD ON      Component Value Range   Squamous Epithelial / LPF FEW (*) RARE   WBC, UA 3-6  <3 WBC/hpf   RBC / HPF 0-2  <3 RBC/hpf   Bacteria, UA FEW (*) RARE   Urine-Other LESS THAN 10 mL OF  URINE SUBMITTED     No results found.   1. Nausea/vomit/diarrhea 2. dehydration  MDM  Pt with GI viral syndrome x 2 weeks.  Is afebrile with stable normal vital sign, and a non surgical abdomen.  Does not appears dehydrated.  Work up initiated. IVF and pain medication given.    10:48 AM Signs of dehydration based on labs.  Pt have received 2 L of NS and felt better.  Able to tolerates PO  and having an appetite.  Will DC.    BP 123/65  Pulse 79  Temp 98.3 F (36.8 C) (Oral)  Resp 17  SpO2 100%  LMP 01/29/2012  I have reviewed nursing notes and vital signs. I personally reviewed the imaging tests through PACS system  I reviewed available ER/hospitalization records thought the EMR     Fayrene Helper, PA-C 02/04/12 1101

## 2012-02-04 NOTE — ED Notes (Signed)
C/o upper abd pain & diarrhea x 2 weeks. N/v started today

## 2012-02-05 LAB — URINE CULTURE: Colony Count: >=100000

## 2012-02-05 NOTE — ED Provider Notes (Signed)
Medical screening examination/treatment/procedure(s) were performed by non-physician practitioner and as supervising physician I was immediately available for consultation/collaboration.  Doug Sou, MD 02/05/12 [phone]

## 2012-02-07 ENCOUNTER — Emergency Department (HOSPITAL_COMMUNITY): Payer: Self-pay

## 2012-02-07 ENCOUNTER — Encounter (HOSPITAL_COMMUNITY): Payer: Self-pay | Admitting: *Deleted

## 2012-02-07 ENCOUNTER — Emergency Department (HOSPITAL_COMMUNITY)
Admission: EM | Admit: 2012-02-07 | Discharge: 2012-02-07 | Disposition: A | Discharge status: Home / Self Care | Payer: Self-pay | Attending: Emergency Medicine | Admitting: Emergency Medicine

## 2012-02-07 DIAGNOSIS — K2990 Gastroduodenitis, unspecified, without bleeding: Secondary | ICD-10-CM | POA: Insufficient documentation

## 2012-02-07 DIAGNOSIS — Z3202 Encounter for pregnancy test, result negative: Secondary | ICD-10-CM | POA: Insufficient documentation

## 2012-02-07 DIAGNOSIS — K2970 Gastritis, unspecified, without bleeding: Secondary | ICD-10-CM | POA: Insufficient documentation

## 2012-02-07 DIAGNOSIS — F17200 Nicotine dependence, unspecified, uncomplicated: Secondary | ICD-10-CM | POA: Insufficient documentation

## 2012-02-07 DIAGNOSIS — R197 Diarrhea, unspecified: Secondary | ICD-10-CM | POA: Insufficient documentation

## 2012-02-07 DIAGNOSIS — R112 Nausea with vomiting, unspecified: Secondary | ICD-10-CM | POA: Insufficient documentation

## 2012-02-07 LAB — CBC WITH DIFFERENTIAL/PLATELET
Basophils Relative: 0 % (ref 0–1)
Eosinophils Relative: 1 % (ref 0–5)
Hemoglobin: 12.5 g/dL (ref 12.0–15.0)
Lymphocytes Relative: 19 % (ref 12–46)
MCH: 25.9 pg — ABNORMAL LOW (ref 26.0–34.0)
Monocytes Absolute: 1.1 10*3/uL — ABNORMAL HIGH (ref 0.1–1.0)
Monocytes Relative: 10 % (ref 3–12)
Neutrophils Relative %: 70 % (ref 43–77)
Platelets: 317 10*3/uL (ref 150–400)
RBC: 4.83 MIL/uL (ref 3.87–5.11)
WBC: 11 10*3/uL — ABNORMAL HIGH (ref 4.0–10.5)

## 2012-02-07 LAB — URINALYSIS, MICROSCOPIC ONLY
Glucose, UA: NEGATIVE mg/dL
Hgb urine dipstick: NEGATIVE
Specific Gravity, Urine: 1.027 (ref 1.005–1.030)

## 2012-02-07 LAB — COMPREHENSIVE METABOLIC PANEL
ALT: 16 U/L (ref 0–35)
AST: 24 U/L (ref 0–37)
Alkaline Phosphatase: 55 U/L (ref 39–117)
CO2: 24 mEq/L (ref 19–32)
Calcium: 9.4 mg/dL (ref 8.4–10.5)
GFR calc non Af Amer: 83 mL/min — ABNORMAL LOW (ref >90)
Potassium: 3.2 mEq/L — ABNORMAL LOW (ref 3.5–5.1)
Sodium: 137 mEq/L (ref 135–145)

## 2012-02-07 MED ORDER — MORPHINE SULFATE 4 MG/ML IJ SOLN
4.0000 mg | Freq: Once | INTRAMUSCULAR | Status: AC
  Administered 2012-02-07: 4 mg via INTRAVENOUS
  Filled 2012-02-07: qty 1

## 2012-02-07 MED ORDER — ONDANSETRON HCL 4 MG/2ML IJ SOLN
4.0000 mg | Freq: Once | INTRAMUSCULAR | Status: AC
  Administered 2012-02-07: 4 mg via INTRAVENOUS
  Filled 2012-02-07: qty 2

## 2012-02-07 MED ORDER — SODIUM CHLORIDE 0.9 % IV BOLUS (SEPSIS)
1000.0000 mL | Freq: Once | INTRAVENOUS | Status: AC
  Administered 2012-02-07: 1000 mL via INTRAVENOUS

## 2012-02-07 MED ORDER — GI COCKTAIL ~~LOC~~
30.0000 mL | Freq: Once | ORAL | Status: AC
  Administered 2012-02-07: 30 mL via ORAL
  Filled 2012-02-07: qty 30

## 2012-02-07 MED ORDER — PANTOPRAZOLE SODIUM 40 MG IV SOLR
40.0000 mg | Freq: Once | INTRAVENOUS | Status: AC
  Administered 2012-02-07: 40 mg via INTRAVENOUS
  Filled 2012-02-07: qty 40

## 2012-02-07 MED ORDER — OMEPRAZOLE 20 MG PO CPDR: 20.0000 mg | DELAYED_RELEASE_CAPSULE | Freq: Every day | ORAL | Status: DC

## 2012-02-07 MED ORDER — POTASSIUM CHLORIDE CRYS ER 20 MEQ PO TBCR
40.0000 meq | EXTENDED_RELEASE_TABLET | Freq: Once | ORAL | Status: AC
  Administered 2012-02-07: 40 meq via ORAL
  Filled 2012-02-07: qty 2

## 2012-02-07 MED ORDER — GI COCKTAIL ~~LOC~~: 30.0000 mL | Freq: Two times a day (BID) | ORAL | Status: DC

## 2012-02-07 NOTE — ED Notes (Signed)
Pt ambulated to bathroom 

## 2012-02-07 NOTE — ED Notes (Signed)
Pt was here on 12/25 for same, told it was a virus. Went to work this am and had onset of severe abd pain again with vomiting and diarrhea.

## 2012-02-07 NOTE — ED Provider Notes (Signed)
History     CSN: [id number]  Arrival date & time 02/07/12  1243   First MD Initiated Contact with Patient 02/07/12 1653      Chief Complaint  Patient presents with  . Abdominal Pain  . Emesis  . Diarrhea    (Consider location/radiation/quality/duration/timing/severity/associated sxs/prior treatment) HPI Comments: Patient is a 31 year old female who presents with a 3 week history of abdominal pain. The pain is located in her epigastrium and radiates around to her right flank. The pain is described as dull and severe. The pain started gradually and progressively worsened since the onset. Aggravating factors include eating and drinking. No alleviating factors. The patient has tried nothing for symptoms without relief. Associated symptoms include nausea, vomiting, and diarrhea. Patient denies fever, headache, chest pain, SOB, dysuria, constipation, abnormal vaginal bleeding/discharge.     Patient is a 31 y.o. female presenting with abdominal pain, vomiting, and diarrhea.  Abdominal Pain The primary symptoms of the illness include abdominal pain, nausea, vomiting and diarrhea.  Emesis  Associated symptoms include abdominal pain and diarrhea.  Diarrhea The primary symptoms include abdominal pain, nausea, vomiting and diarrhea.    History reviewed. No pertinent past medical history.  History reviewed. No pertinent past surgical history.  History reviewed. No pertinent family history.  History  Substance Use Topics  . Smoking status: Current Every Day Smoker  . Smokeless tobacco: Not on file  . Alcohol Use: Yes     Comment: occasional    OB History    Grav Para Term Preterm Abortions TAB SAB Ect Mult Living                  Review of Systems  Gastrointestinal: Positive for nausea, vomiting, abdominal pain and diarrhea.  All other systems reviewed and are negative.    Allergies  Review of patient's allergies indicates no known allergies.  Home Medications    Current Outpatient Rx  Name  Route  Sig  Dispense  Refill  . PROMETHAZINE HCL 25 MG PO TABS   Oral   Take 1 tablet (25 mg total) by mouth every 6 (six) hours as needed for nausea.   20 tablet   0     BP 125/86  Pulse 75  Temp 98.3 F (36.8 C) (Oral)  Resp 16  SpO2 96%  LMP 01/29/2012  Physical Exam  Nursing note and vitals reviewed. Constitutional: She is oriented to person, place, and time. She appears well-developed and well-nourished. No distress.  HENT:  Head: Normocephalic and atraumatic.  Eyes: Conjunctivae normal and EOM are normal. Pupils are equal, round, and reactive to light. No scleral icterus.  Neck: Normal range of motion. Neck supple.  Cardiovascular: Normal rate and regular rhythm.  Exam reveals no gallop and no friction rub.   No murmur heard. Pulmonary/Chest: Effort normal and breath sounds normal. She has no wheezes. She has no rales. She exhibits no tenderness.  Abdominal: Soft. She exhibits no distension. There is tenderness. There is no rebound and no guarding.       RUQ and epigastric tenderness to palpation.   Musculoskeletal: Normal range of motion.  Neurological: She is alert and oriented to person, place, and time. Coordination normal.       Speech is goal-oriented. Moves limbs without ataxia.   Skin: Skin is warm and dry. She is not diaphoretic.  Psychiatric: She has a normal mood and affect. Her behavior is normal.    ED Course  Procedures (including critical care time)  Labs Reviewed  CBC WITH DIFFERENTIAL - Abnormal; Notable for the following:    WBC 11.0 (*)     MCV 77.0 (*)     MCH 25.9 (*)     Monocytes Absolute 1.1 (*)     All other components within normal limits  COMPREHENSIVE METABOLIC PANEL - Abnormal; Notable for the following:    Potassium 3.2 (*)     BUN 5 (*)     Total Bilirubin 0.2 (*)     GFR calc non Af Amer 83 (*)     All other components within normal limits  URINALYSIS, MICROSCOPIC ONLY - Abnormal; Notable for  the following:    APPearance HAZY (*)     Bilirubin Urine SMALL (*)     Ketones, ur 15 (*)     Bacteria, UA FEW (*)     Squamous Epithelial / LPF MANY (*)     All other components within normal limits  LIPASE, BLOOD  POCT PREGNANCY, URINE   US Abdomen Complete  02/07/2012  *RADIOLOGY REPORT*  Clinical Data:  Abdominal pain  COMPLETE ABDOMINAL ULTRASOUND  Comparison:  None.  Findings:  Gallbladder:  No gallstones, gallbladder wall thickening, or pericholecystic fluid.  Common bile duct:  Normal at 3 mm  Liver:  Normal echogenicity.  No ductal dilatation.  Within the inferior aspect of the right hepatic lobe there is an irregular contour of the liver measuring 3.2 x 2.3 x 2.9 cm.  Cannot exclude a hepatic mass.  There is color flow within this lesion.  Lesion is isointense to the liver.  IVC:  Appears normal.  Pancreas:  No focal abnormality seen.  Spleen:  Normal size and echogenicity.  Right Kidney:  12.2cm in length.  No evidence of hydronephrosis or stones.  Left Kidney:  11.2cm in length.  No evidence of hydronephrosis or stones.  Abdominal aorta:  No aneurysm identified.  IMPRESSION:  1.  Potential hepatic mass in the inferior right hepatic lobe is not fully characterized.  Recommend abdominal CT with contrast (liver protocol) for further evaluation. 2.  Normal gallbladder. 3.  Normal kidneys.   Original Report Authenticated By: Genevive Bi, M.D.      1. Gastritis       MDM  5:44 PM Labs unremarkable. RUQ [place] pending.   8:34 PM Gallbladder unremarkable based on RUQ [place] results. Patient feeling better. Will treat as gastritis with protonix and GI cocktail. I will discharge the patient with omeprazole and gi cocktail prescriptions. Vitals stable. Patient can be discharged with instructions to return with worsening or concerning symptoms.       Emilia Beck, PA-C 02/07/12 2223

## 2012-02-07 NOTE — ED Notes (Signed)
Pt placed on stretcher in hallway. Pt has soup with her.

## 2012-02-07 NOTE — ED Provider Notes (Signed)
Medical screening examination/treatment/procedure(s) were performed by non-physician practitioner and as supervising physician I was immediately available for consultation/collaboration.   Loren Racer, MD 02/07/12 2229

## 2012-02-07 NOTE — ED Notes (Signed)
Pt in radiology upon shift change. Have not assessed.

## 2012-06-21 ENCOUNTER — Emergency Department (HOSPITAL_COMMUNITY)
Admission: EM | Admit: 2012-06-21 | Discharge: 2012-06-21 | Disposition: A | Discharge status: Home / Self Care | Payer: Self-pay | Attending: Emergency Medicine | Admitting: Emergency Medicine

## 2012-06-21 ENCOUNTER — Encounter (HOSPITAL_COMMUNITY): Payer: Self-pay

## 2012-06-21 ENCOUNTER — Emergency Department (HOSPITAL_COMMUNITY): Payer: Self-pay

## 2012-06-21 DIAGNOSIS — S43421A Sprain of right rotator cuff capsule, initial encounter: Secondary | ICD-10-CM

## 2012-06-21 DIAGNOSIS — S43429A Sprain of unspecified rotator cuff capsule, initial encounter: Secondary | ICD-10-CM | POA: Insufficient documentation

## 2012-06-21 DIAGNOSIS — Y990 Civilian activity done for income or pay: Secondary | ICD-10-CM | POA: Insufficient documentation

## 2012-06-21 DIAGNOSIS — F17200 Nicotine dependence, unspecified, uncomplicated: Secondary | ICD-10-CM | POA: Insufficient documentation

## 2012-06-21 DIAGNOSIS — Y929 Unspecified place or not applicable: Secondary | ICD-10-CM | POA: Insufficient documentation

## 2012-06-21 DIAGNOSIS — W1809XA Striking against other object with subsequent fall, initial encounter: Secondary | ICD-10-CM | POA: Insufficient documentation

## 2012-06-21 DIAGNOSIS — Y9389 Activity, other specified: Secondary | ICD-10-CM | POA: Insufficient documentation

## 2012-06-21 MED ORDER — OXYCODONE-ACETAMINOPHEN 5-325 MG PO TABS: 1.0000 | ORAL_TABLET | Freq: Four times a day (QID) | ORAL | Status: DC | PRN

## 2012-06-21 MED ORDER — OXYCODONE-ACETAMINOPHEN 5-325 MG PO TABS
1.0000 | ORAL_TABLET | Freq: Once | ORAL | Status: AC
  Administered 2012-06-21: 1 via ORAL
  Filled 2012-06-21: qty 1

## 2012-06-21 NOTE — ED Provider Notes (Signed)
History     CSN: [id number]  Arrival date & time 06/21/12  1634   First MD Initiated Contact with Patient 06/21/12 1836      Chief Complaint  Patient presents with  . Shoulder Pain    (Consider location/radiation/quality/duration/timing/severity/associated sxs/prior treatment) Patient is a 32 y.o. female presenting with shoulder pain. The history is provided by the patient.  Shoulder Pain This is a new problem. Pertinent negatives include no chest pain, no headaches and no shortness of breath.   patient states that she was lifting something heavy at work a week or 2 ago and felt a pop in her shoulder. 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 She states it hurts to move. No numbness weakness. Decreased range of motion due to pain. She's not had previous shoulder problems. No numbness or weakness. No change of color. No other injury.  History reviewed. No pertinent past medical history.  History reviewed. No pertinent past surgical history.  History reviewed. No pertinent family history.  History  Substance Use Topics  . Smoking status: Current Every Day Smoker  . Smokeless tobacco: Not on file  . Alcohol Use: Yes     Comment: occasional    OB History   Grav Para Term Preterm Abortions TAB SAB Ect Mult Living                  Review of Systems  Respiratory: Negative for chest tightness and shortness of breath.   Cardiovascular: Negative for chest pain.  Musculoskeletal: Negative for myalgias, back pain, joint swelling, arthralgias and gait problem.  Skin: Negative for rash and wound.  Neurological: Negative for weakness, numbness and headaches.    Allergies  Review of patient's allergies indicates no known allergies.  Home Medications   Current Outpatient Rx  Name  Route  Sig  Dispense  Refill  . acetaminophen (TYLENOL) 325 MG tablet   Oral   Take 650 mg by mouth every 6 (six)  hours as needed for pain or fever.         Marland Kitchen oxyCODONE-acetaminophen (PERCOCET/ROXICET) 5-325 MG per tablet   Oral   Take 1-2 tablets by mouth every 6 (six) hours as needed for pain.   10 tablet   0     BP 122/57  Pulse 105  Temp(Src) 99.3 F (37.4 C) (Oral)  Resp 16  SpO2 97%  LMP 05/30/2012  Physical Exam  Constitutional: She appears well-developed and well-nourished.  Musculoskeletal: She exhibits tenderness. She exhibits no edema.  Tenderness over right shoulder worse anteriorly. Decreased range of motion both active and passive movement. No deformity. Neurovascular intact distally. Able to flex and extend at the elbow. Sensation intact over median radial and ulnar distributions. Strong radial pulse. Skin is normal color  Skin: No rash noted. No erythema.    ED Course  Procedures (including critical care time)  Labs Reviewed - No data to display Dg Shoulder Right  06/21/2012  *RADIOLOGY REPORT*  Clinical Data: Right shoulder pain.  RIGHT SHOULDER - 2+ VIEW  Comparison: None.  Findings: No acute bony abnormality.  Specifically, no fracture, subluxation, or dislocation.  Soft tissues are intact. Joint spaces are maintained.  Normal bone mineralization.  IMPRESSION: No acute bony abnormality.   Original Report Authenticated By: Charlett Nose, M.D.      1. Rotator cuff (capsule) sprain, right, initial encounter       MDM  Patient with shoulder pain. Likely due to rotator cuff. X-ray does not show dislocation or bony abnormality. Patient was given sling for comfort and will followup with her orthopedic surgeon. She was given pain medicines also.        Juliet Rude. Rubin Payor, MD 06/21/12 [phone]

## 2012-06-21 NOTE — ED Notes (Signed)
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

## 2013-02-09 ENCOUNTER — Encounter (HOSPITAL_COMMUNITY): Payer: Self-pay | Admitting: Emergency Medicine

## 2013-02-09 ENCOUNTER — Emergency Department (HOSPITAL_COMMUNITY)
Admission: EM | Admit: 2013-02-09 | Discharge: 2013-02-09 | Disposition: A | Discharge status: Home / Self Care | Payer: Medicaid Other | Attending: Emergency Medicine | Admitting: Emergency Medicine

## 2013-02-09 DIAGNOSIS — L923 Foreign body granuloma of the skin and subcutaneous tissue: Secondary | ICD-10-CM

## 2013-02-09 DIAGNOSIS — F17200 Nicotine dependence, unspecified, uncomplicated: Secondary | ICD-10-CM | POA: Insufficient documentation

## 2013-02-09 DIAGNOSIS — S8990XA Unspecified injury of unspecified lower leg, initial encounter: Secondary | ICD-10-CM | POA: Insufficient documentation

## 2013-02-09 DIAGNOSIS — Z79899 Other long term (current) drug therapy: Secondary | ICD-10-CM | POA: Insufficient documentation

## 2013-02-09 DIAGNOSIS — X58XXXA Exposure to other specified factors, initial encounter: Secondary | ICD-10-CM | POA: Insufficient documentation

## 2013-02-09 DIAGNOSIS — Y929 Unspecified place or not applicable: Secondary | ICD-10-CM | POA: Insufficient documentation

## 2013-02-09 DIAGNOSIS — Y939 Activity, unspecified: Secondary | ICD-10-CM | POA: Insufficient documentation

## 2013-02-09 MED ORDER — OXYCODONE-ACETAMINOPHEN 5-325 MG PO TABS
1.0000 | ORAL_TABLET | Freq: Once | ORAL | Status: AC
  Administered 2013-02-09: 1 via ORAL
  Filled 2013-02-09: qty 1

## 2013-02-09 MED ORDER — OXYCODONE-ACETAMINOPHEN 5-325 MG PO TABS
1.0000 | ORAL_TABLET | Freq: Four times a day (QID) | ORAL | Status: DC | PRN
Start: 2013-02-09 — End: 2013-03-26

## 2013-02-09 MED ORDER — AQUAPHOR EX OINT: TOPICAL_OINTMENT | CUTANEOUS | Status: DC | PRN

## 2013-02-09 NOTE — ED Provider Notes (Signed)
CSN: [id number]     Arrival date & time 02/09/13  1111 History  This chart was scribed for non-physician practitioner Francee Piccolo, PA-C, working with Geoffery Lyons, MD by Dorothey Baseman, ED Scribe. This patient was seen in room TR10C/TR10C and the patient's care was started at 12:16 PM.    Chief Complaint  Patient presents with  . Foot Pain   The history is provided by the patient. No language interpreter was used.   HPI Comments: Kari Porter is a 32 y.o. female who presents to the Emergency Department complaining of a constant, throbbing pain with associated swelling and paresthesias to the top of the left foot onset 3 days ago after she reports getting a tattoo on the area. She states that the pain is exacerbated with walking/bearing weight. She reports taking Motrin at home without relief and applying ice to the area with significant relief of the swelling. Patient reports that she has had tattoos before, but that this is the first time she had green ink, and that her current symptoms are different from her past tattoos. She reports that she went to the same tattoo artist that she has in the past and confirms that they follow the necessary precautions. She denies any abnormal drainage from the area, but states that she noticed some yellow-colored fluid when she removed her sock. She denies fever. Patient has no other pertinent medical history.   History reviewed. No pertinent past medical history. History reviewed. No pertinent past surgical history. History reviewed. No pertinent family history. History  Substance Use Topics  . Smoking status: Current Every Day Smoker  . Smokeless tobacco: Not on file  . Alcohol Use: Yes     Comment: occasional   OB History   Grav Para Term Preterm Abortions TAB SAB Ect Mult Living                 Review of Systems  Musculoskeletal: Positive for arthralgias, joint swelling and myalgias.  All other systems reviewed and are  negative.    Allergies  Review of patient's allergies indicates no known allergies.  Home Medications   Current Outpatient Rx  Name  Route  Sig  Dispense  Refill  . BIOTIN PO   Oral   Take 1 tablet by mouth daily.         Marland Kitchen ibuprofen (ADVIL,MOTRIN) 200 MG tablet   Oral   Take 800 mg by mouth every 6 (six) hours as needed for moderate pain.         . mineral oil-hydrophilic petrolatum (AQUAPHOR) ointment   Topical   Apply topically as needed for dry skin. Apply to tattoo to keep tattoo wet ~ 3-4x a time for 3-4 days   420 g   0   . oxyCODONE-acetaminophen (PERCOCET/ROXICET) 5-325 MG per tablet   Oral   Take 1-2 tablets by mouth every 6 (six) hours as needed for severe pain.   15 tablet   0    Triage Vitals: BP 136/95  Pulse 80  Temp(Src) 97.9 F (36.6 C) (Oral)  Resp 19  SpO2 99%  LMP 01/23/2013  Physical Exam  Constitutional: She is oriented to person, place, and time. She appears well-developed and well-nourished. No distress.  HENT:  Head: Normocephalic and atraumatic.  Right Ear: External ear normal.  Left Ear: External ear normal.  Nose: Nose normal.  Mouth/Throat: Oropharynx is clear and moist.  Eyes: Conjunctivae are normal.  Neck: Normal range of motion. Neck supple.  Cardiovascular: Normal  rate.   Pulmonary/Chest: Effort normal.  Abdominal: Soft.  Musculoskeletal: Normal range of motion.  Neurological: She is alert and oriented to person, place, and time. She has normal strength. She displays no atrophy. No sensory deficit. GCS eye subscore is 4. GCS verbal subscore is 5. GCS motor subscore is 6.  Skin: Skin is warm and dry. No abrasion and no rash noted. She is not diaphoretic. There is erythema (to tattoo on left dorsal side of foot).  Mild warmth to the area without significant surrounding erythema or red streaking.   Psychiatric: She has a normal mood and affect.    ED Course  Procedures (including critical care time)  DIAGNOSTIC  STUDIES: Oxygen Saturation is 99% on room air, normal by my interpretation.    COORDINATION OF CARE: 12:21 PM- Discussed that there are no signs of infection at this time. Will order pain medication to manage symptoms. Will discharge patient with Aquaphor to apply to the area. Advised patient to keep the area clean and moist with the Aquaphor until it has completely healed. Return precautions given. Discussed treatment plan with patient at bedside and patient verbalized agreement.     Labs Review Labs Reviewed - No data to display Imaging Review No results found.  EKG Interpretation   None       MDM   1. Tattoo reaction     Afebrile, NAD, non-toxic appearing, AAOx4. Neurovascularly intact. Normal sensation. Slight erythema and warmth to area of new tattoo, no erythema otherwise. No red streaking from tattoo. Patient likely rejecting tattoo ink, first time with green ink (high susceptibility to rejection). Tattoo w/ scabbing present. Patient is also not following appropriate new tattoo instructions to prevent infection. Advised patient to use Aquaphor to keep tattoo moist. Use dial soaps to cleanse the area and avoid body washes that are drying. Advised after 3-4 days of aquaphor use that the patient should use hydrating non-scented lotions to continue to keep the tattoo moist for an additional 5-7 days. Return precautions discussed. Patient is agreeable to plan. Patient is stable at time of discharge     I personally performed the services described in this documentation, which was scribed in my presence. The recorded information has been reviewed and is accurate.      Jeannetta Ellis, PA-C 02/09/13 1309

## 2013-02-09 NOTE — ED Notes (Signed)
Reports pain and swelling to left foot since having tattoo done on top of left foot on Sunday. No redness or severe swelling noted to foot at triage.

## 2013-02-09 NOTE — ED Provider Notes (Signed)
Medical screening examination/treatment/procedure(s) were performed by non-physician practitioner and as supervising physician I was immediately available for consultation/collaboration.     Ezekeil Bethel, MD 02/09/13 1317 

## 2013-02-14 ENCOUNTER — Emergency Department (HOSPITAL_COMMUNITY)
Admission: EM | Admit: 2013-02-14 | Discharge: 2013-02-14 | Disposition: A | Discharge status: Home / Self Care | Payer: Medicaid Other | Attending: Emergency Medicine | Admitting: Emergency Medicine

## 2013-02-14 ENCOUNTER — Encounter (HOSPITAL_COMMUNITY): Payer: Self-pay | Admitting: Emergency Medicine

## 2013-02-14 DIAGNOSIS — Z79899 Other long term (current) drug therapy: Secondary | ICD-10-CM | POA: Insufficient documentation

## 2013-02-14 DIAGNOSIS — L989 Disorder of the skin and subcutaneous tissue, unspecified: Secondary | ICD-10-CM | POA: Insufficient documentation

## 2013-02-14 DIAGNOSIS — F17200 Nicotine dependence, unspecified, uncomplicated: Secondary | ICD-10-CM | POA: Insufficient documentation

## 2013-02-14 DIAGNOSIS — L03116 Cellulitis of left lower limb: Secondary | ICD-10-CM

## 2013-02-14 DIAGNOSIS — M79672 Pain in left foot: Secondary | ICD-10-CM

## 2013-02-14 DIAGNOSIS — L03119 Cellulitis of unspecified part of limb: Principal | ICD-10-CM

## 2013-02-14 DIAGNOSIS — L02619 Cutaneous abscess of unspecified foot: Secondary | ICD-10-CM | POA: Insufficient documentation

## 2013-02-14 MED ORDER — SULFAMETHOXAZOLE-TRIMETHOPRIM 800-160 MG PO TABS: 1.0000 | ORAL_TABLET | Freq: Two times a day (BID) | ORAL | Status: DC

## 2013-02-14 MED ORDER — NAPROXEN 500 MG PO TABS: 500.0000 mg | ORAL_TABLET | Freq: Two times a day (BID) | ORAL | Status: DC

## 2013-02-14 MED ORDER — CEPHALEXIN 500 MG PO CAPS: 500.0000 mg | ORAL_CAPSULE | Freq: Three times a day (TID) | ORAL | Status: DC

## 2013-02-14 NOTE — ED Provider Notes (Signed)
CSN: [id number]     Arrival date & time 02/14/13  1510 History   First MD Initiated Contact with Patient 02/14/13 1706     Chief Complaint  Patient presents with  . Foot Pain   (Consider location/radiation/quality/duration/timing/severity/associated sxs/prior Treatment) HPI Comments: 33 yo female with left foot pain and redness worsening recently, she had tatoo on left dorsum foot on 12/28 and seen in ED recently however pt has had mild pus draining and redness has not improved. No dm, no streaking.  Pain with palpation, pt walks a lot during day.  No fevers.   Patient is a 33 y.o. female presenting with lower extremity pain. The history is provided by the patient.  Foot Pain This is a recurrent problem. Pertinent negatives include no chest pain, no abdominal pain, no headaches and no shortness of breath.    History reviewed. No pertinent past medical history. History reviewed. No pertinent past surgical history. No family history on file. History  Substance Use Topics  . Smoking status: Current Every Day Smoker  . Smokeless tobacco: Not on file  . Alcohol Use: Yes     Comment: occasional   OB History   Grav Para Term Preterm Abortions TAB SAB Ect Mult Living                 Review of Systems  Constitutional: Negative for fever and chills.  HENT: Negative for congestion.   Eyes: Negative for visual disturbance.  Respiratory: Negative for shortness of breath.   Cardiovascular: Negative for chest pain.  Gastrointestinal: Negative for vomiting and abdominal pain.  Genitourinary: Negative for dysuria and flank pain.  Skin: Positive for rash and wound.  Neurological: Negative for light-headedness and headaches.    Allergies  Review of patient's allergies indicates no known allergies.  Home Medications   Current Outpatient Rx  Name  Route  Sig  Dispense  Refill  . BIOTIN PO   Oral   Take 1 tablet by mouth daily.         Marland Kitchen ibuprofen (ADVIL,MOTRIN) 200 MG tablet    Oral   Take 800 mg by mouth every 6 (six) hours as needed for moderate pain.         . mineral oil-hydrophilic petrolatum (AQUAPHOR) ointment   Topical   Apply topically as needed for dry skin. Apply to tattoo to keep tattoo wet ~ 3-4x a time for 3-4 days   420 g   0   . oxyCODONE-acetaminophen (PERCOCET/ROXICET) 5-325 MG per tablet   Oral   Take 1-2 tablets by mouth every 6 (six) hours as needed for severe pain.   15 tablet   0   . cephALEXin (KEFLEX) 500 MG capsule   Oral   Take 1 capsule (500 mg total) by mouth 3 (three) times daily.   21 capsule   0   . naproxen (NAPROSYN) 500 MG tablet   Oral   Take 1 tablet (500 mg total) by mouth 2 (two) times daily.   20 tablet   0   . sulfamethoxazole-trimethoprim (SEPTRA DS) 800-160 MG per tablet   Oral   Take 1 tablet by mouth 2 (two) times daily.   14 tablet   0    BP 134/76  Pulse 85  Temp(Src) 98.2 F (36.8 C) (Oral)  Resp 18  Wt 218 lb 1.6 oz (98.93 kg)  SpO2 97%  LMP 01/23/2013 Physical Exam  Nursing note and vitals reviewed. Constitutional: She is oriented to person, place, and  time. She appears well-developed and well-nourished.  HENT:  Head: Normocephalic and atraumatic.  Eyes: Conjunctivae are normal. Right eye exhibits no discharge. Left eye exhibits no discharge.  Neck: Normal range of motion.  Cardiovascular: Normal rate and regular rhythm.   Pulmonary/Chest: Effort normal.  Musculoskeletal: She exhibits edema and tenderness.  Neurological: She is alert and oriented to person, place, and time.  Skin: Skin is warm. No rash noted. There is erythema.  Left dorsum tattoo with mild erythema/ tenderness surrounding ink, no discharge, no induration or crepitus on foot, nv intact, no streaking  Psychiatric: She has a normal mood and affect.    ED Course  Procedures (including critical care time)EMERGENCY DEPARTMENT US SOFT TISSUE INTERPRETATION "Study: Limited Soft Tissue Ultrasound"  INDICATIONS: Pain  and Soft tissue infection Multiple views of the body part were obtained in real-time with a multi-frequency linear probe PERFORMED BY:  Myself IMAGES ARCHIVED?: Yes SIDE:Left BODY PART:Lower extremity FINDINGS: No abcess noted INTERPRETATION:  No abcess noted  Labs Review Labs Reviewed - No data to display Imaging Review No results found.  EKG Interpretation   None       MDM   1. Cellulitis of left foot   2. Left foot pain    Clinically cellulitis. No streaking or fevers.  No signs of abscess, bedside US not abscess seen. Plan for abx, po pain meds and recheck. Well appearing in ED.  Results and differential diagnosis were discussed with the patient. Close follow up outpatient was discussed, patient comfortable with the plan.   Diagnosis: above     Enid SkeensJoshua M Maebry Obrien, MD 02/14/13 2314

## 2013-02-14 NOTE — Discharge Instructions (Signed)
If you were given medicines take as directed.  If you are on coumadin or contraceptives realize their levels and effectiveness is altered by many different medicines.  If you have any reaction (rash, tongues swelling, other) to the medicines stop taking and see a physician.   Please follow up as directed and return to the ER or see a physician for new or worsening symptoms (streaking up leg, persistent fevers, new concerns).  Thank you.

## 2013-02-14 NOTE — Progress Notes (Signed)
In coming call from patient in regards to prescription. Pt was treated and discharged today [facility]. As per patient she received 3 prescription that she reports, being unable to afford the cost. Pt made aware of [facility] and the guidelines Pt was appreciative and agreeable with plan. No further CM needs identified.

## 2013-02-14 NOTE — ED Notes (Signed)
Patient presents today with a chief complaint of left foot pain and swelling since 02/06/13 after getting a tatoo on the dorsal part of her foot. Patient [facility]. Patient thinks she may be having a reaction to the ink., patient ambulatory in department.

## 2013-03-26 ENCOUNTER — Encounter (HOSPITAL_COMMUNITY): Payer: Self-pay | Admitting: Emergency Medicine

## 2013-03-26 ENCOUNTER — Emergency Department (HOSPITAL_COMMUNITY)
Admission: EM | Admit: 2013-03-26 | Discharge: 2013-03-26 | Disposition: A | Discharge status: Home / Self Care | Payer: Medicaid Other | Attending: Emergency Medicine | Admitting: Emergency Medicine

## 2013-03-26 DIAGNOSIS — J680 Bronchitis and pneumonitis due to chemicals, gases, fumes and vapors: Secondary | ICD-10-CM

## 2013-03-26 DIAGNOSIS — R52 Pain, unspecified: Secondary | ICD-10-CM | POA: Insufficient documentation

## 2013-03-26 DIAGNOSIS — J029 Acute pharyngitis, unspecified: Secondary | ICD-10-CM | POA: Insufficient documentation

## 2013-03-26 DIAGNOSIS — Z79899 Other long term (current) drug therapy: Secondary | ICD-10-CM | POA: Insufficient documentation

## 2013-03-26 DIAGNOSIS — F17200 Nicotine dependence, unspecified, uncomplicated: Secondary | ICD-10-CM | POA: Insufficient documentation

## 2013-03-26 DIAGNOSIS — R0789 Other chest pain: Secondary | ICD-10-CM | POA: Insufficient documentation

## 2013-03-26 DIAGNOSIS — J40 Bronchitis, not specified as acute or chronic: Secondary | ICD-10-CM | POA: Insufficient documentation

## 2013-03-26 MED ORDER — BENZONATATE 100 MG PO CAPS: 200.0000 mg | ORAL_CAPSULE | Freq: Two times a day (BID) | ORAL | Status: DC | PRN

## 2013-03-26 MED ORDER — KETOROLAC TROMETHAMINE 30 MG/ML IJ SOLN
30.0000 mg | Freq: Once | INTRAMUSCULAR | Status: AC
  Administered 2013-03-26: 30 mg via INTRAMUSCULAR
  Filled 2013-03-26: qty 1

## 2013-03-26 NOTE — Discharge Instructions (Signed)
Call for a follow up appointment with a Family or Primary Care Provider.  Avoid chemicals that irritate your throat and lungs. Return if Symptoms worsen.   Take medication as prescribed.

## 2013-03-26 NOTE — ED Provider Notes (Signed)
CSN: [id number]     Arrival date & time 03/26/13  4098 History   First MD Initiated Contact with Patient 03/26/13 1028     Chief Complaint  Patient presents with  . Cough  . Generalized Body Aches   Patient is a 33 y.o. female presenting with cough. The history is provided by the patient. No language interpreter was used.  Cough Associated symptoms: sore throat   Associated symptoms: no chills, no fever, no shortness of breath and no wheezing    This chart was scribed for non-physician practitioner working with Mellody Drown, PA-C by Andrew Au, ED Scribe. This patient was seen in room TR07C/TR07C and the patient's care was started at 11:00 AM.  Montasia Chisenhall is a 33 y.o. female who presents to the Emergency Department complaining of a cough onset 2 days . Pt states that she has recently moved and that she has been cleaning with bleach and other household cleaners without circulation of fresh air for the past 2 days. She reports sneezing attacks, and that she is in pain when coughing. She states that it is very difficult to cough but feels the urge too but it wont come out.  She states that when she woke up this morning she had chest tightness and sore throat.  She denies fever and SOB. She denies having a cold. She denies allergies.  History reviewed. No pertinent past medical history. History reviewed. No pertinent past surgical history. No family history on file. History  Substance Use Topics  . Smoking status: Current Every Day Smoker  . Smokeless tobacco: Not on file  . Alcohol Use: Yes     Comment: occasional   OB History   Grav Para Term Preterm Abortions TAB SAB Ect Mult Living                 Review of Systems  Constitutional: Negative for fever and chills.  HENT: Positive for sore throat. Negative for mouth sores.   Respiratory: Positive for cough and chest tightness. Negative for shortness of breath and wheezing.   Gastrointestinal: Negative for nausea, vomiting and  abdominal pain.   Allergies  Review of patient's allergies indicates no known allergies.  Home Medications   Current Outpatient Rx  Name  Route  Sig  Dispense  Refill  . BIOTIN PO   Oral   Take 1 tablet by mouth daily.          BP 128/72  Pulse 87  Temp(Src) 98.2 F (36.8 C) (Oral)  Resp 18  SpO2 100% Physical Exam  Nursing note and vitals reviewed. Constitutional: She is oriented to person, place, and time. She appears well-developed and well-nourished. No distress.  HENT:  Head: Normocephalic and atraumatic.  Nose: Rhinorrhea present.  Mouth/Throat: Posterior oropharyngeal erythema present. No oropharyngeal exudate or posterior oropharyngeal edema.  Eyes: Conjunctivae and EOM are normal. Right eye exhibits no discharge. Left eye exhibits no discharge.  Neck: Normal range of motion. Neck supple.  Cardiovascular: Normal rate.   Pulmonary/Chest: Effort normal. No stridor. No respiratory distress. She has no wheezes. She has no rales. She exhibits tenderness.  Abdominal: Soft.  Musculoskeletal: Normal range of motion.  Neurological: She is alert and oriented to person, place, and time.  Skin: Skin is warm and dry.  Psychiatric: She has a normal mood and affect. Her behavior is normal.    ED Course  Procedures (including critical care time) Labs Review Labs Reviewed - No data to display Imaging Review No results  found.  EKG Interpretation   None       MDM   Final diagnoses:  Bronchitis due to fumes and vapors   Pt reports coughing after cleaning her apartment with bleach without proper ventilation.  No wheezing on exam, likely bronchitis due to chemical irritation.  Will treat symptomatically. Discussed treatment plan with the patient. Return precautions given. Reports understanding and no other concerns at this time.  Patient is stable for discharge at this time.  Medications order this encounter:  Meds ordered this encounter  Medications  . ketorolac  (TORADOL) 30 MG/ML injection 30 mg    Sig:   . benzonatate (TESSALON) 100 MG capsule    Sig: Take 2 capsules (200 mg total) by mouth 2 (two) times daily as needed for cough.    Dispense:  20 capsule    Refill:  0    Order Specific Question:  Supervising Provider    Answer:  Eber HongMILLER, BRIAN D [[id number]]     I personally performed the services described in this documentation, which was scribed in my presence. The recorded information has been reviewed and is accurate.      Clabe SealLauren M Cristin Szatkowski, PA-C 03/28/13 [phone]

## 2013-03-26 NOTE — ED Notes (Signed)
Pt presents to department for evaluation of cough and body aches. States she just moved into new house and has been cleaning with several different chemicals. States cough and congestion after using bleach to clean floors. No relief of symptoms at home. Respirations unlabored. Denies pain at present. No signs of acute distress noted.

## 2013-03-28 NOTE — ED Provider Notes (Signed)
Medical screening examination/treatment/procedure(s) were performed by non-physician practitioner and as supervising physician I was immediately available for consultation/collaboration.  EKG Interpretation   None         Baran Kuhrt L Kenleigh Toback, MD 03/28/13 2337 

## 2013-04-28 ENCOUNTER — Emergency Department (HOSPITAL_COMMUNITY)
Admission: EM | Admit: 2013-04-28 | Discharge: 2013-04-28 | Disposition: A | Discharge status: Home / Self Care | Payer: Medicaid Other | Attending: Emergency Medicine | Admitting: Emergency Medicine

## 2013-04-28 ENCOUNTER — Encounter (HOSPITAL_COMMUNITY): Payer: Self-pay | Admitting: Emergency Medicine

## 2013-04-28 DIAGNOSIS — L0201 Cutaneous abscess of face: Secondary | ICD-10-CM | POA: Insufficient documentation

## 2013-04-28 DIAGNOSIS — Z79899 Other long term (current) drug therapy: Secondary | ICD-10-CM | POA: Insufficient documentation

## 2013-04-28 DIAGNOSIS — F17200 Nicotine dependence, unspecified, uncomplicated: Secondary | ICD-10-CM | POA: Insufficient documentation

## 2013-04-28 DIAGNOSIS — L03211 Cellulitis of face: Principal | ICD-10-CM | POA: Insufficient documentation

## 2013-04-28 MED ORDER — SULFAMETHOXAZOLE-TRIMETHOPRIM 800-160 MG PO TABS: 1.0000 | ORAL_TABLET | Freq: Two times a day (BID) | ORAL | Status: DC

## 2013-04-28 MED ORDER — FLUCONAZOLE 150 MG PO TABS: 150.0000 mg | ORAL_TABLET | Freq: Once | ORAL | Status: DC

## 2013-04-28 NOTE — Discharge Instructions (Signed)
Take the prescribed medication as directed. May continue using warm compresses on face to help with healing. Return to the ED for new or worsening symptoms.

## 2013-04-28 NOTE — ED Notes (Signed)
Started 3 days ago with "boil" on left maxillary area. States it was large last pm but "it must have drained", much smaller and less painful today.

## 2013-04-28 NOTE — ED Provider Notes (Signed)
CSN: [id number]     Arrival date & time 04/28/13  1019 History  This chart was scribed for non-physician practitioner, Sharilyn Sites, PA-C working with Juliet Rude. Rubin Payor, MD by Greggory Stallion, ED scribe. This patient was seen in room TR05C/TR05C and the patient's care was started at 10:30 AM.   Chief Complaint  Patient presents with  . Recurrent Skin Infections   The history is provided by the patient. No language interpreter was used.   HPI Comments: Kari Porter is a 33 y.o. female who presents to the Emergency Department complaining of an abscess to her left maxillary area that started 3 days ago. Pt states it was large last night but it was much smaller and less painful this morning after applying warm compresses.  Pt now has very mild swelling of left upper cheek.  No lid involvement or visual impairment. Denies fever. Denies history of MRSA.   No past medical history on file. No past surgical history on file. No family history on file. History  Substance Use Topics  . Smoking status: Current Every Day Smoker  . Smokeless tobacco: Not on file  . Alcohol Use: Yes     Comment: occasional   OB History   Grav Para Term Preterm Abortions TAB SAB Ect Mult Living                 Review of Systems  Constitutional: Negative for fever.  HENT: Positive for facial swelling.   Skin:       Positive for abscess.  All other systems reviewed and are negative.   Allergies  Review of patient's allergies indicates no known allergies.  Home Medications   Current Outpatient Rx  Name  Route  Sig  Dispense  Refill  . benzonatate (TESSALON) 100 MG capsule   Oral   Take 2 capsules (200 mg total) by mouth 2 (two) times daily as needed for cough.   20 capsule   0   . BIOTIN PO   Oral   Take 1 tablet by mouth daily.          LMP 04/07/2013  Physical Exam  Nursing note and vitals reviewed. Constitutional: She is oriented to person, place, and time. She appears well-developed and  well-nourished. No distress.  HENT:  Head: Normocephalic and atraumatic.    Mouth/Throat: Oropharynx is clear and moist.  Small abscess to left cheek. Mild erythema and swelling without induration or cellulitic change. No drainage or central fluctuance.  No extension to lower eyelid or visual impairment  Eyes: Conjunctivae and EOM are normal. Pupils are equal, round, and reactive to light.  Neck: Normal range of motion.  Cardiovascular: Normal rate, regular rhythm and normal heart sounds.   Pulmonary/Chest: Effort normal and breath sounds normal. No respiratory distress. She has no wheezes.  Musculoskeletal: Normal range of motion.  Neurological: She is alert and oriented to person, place, and time.  Skin: Skin is warm and dry. She is not diaphoretic.  Psychiatric: She has a normal mood and affect.    ED Course  Procedures (including critical care time)  COORDINATION OF CARE: 10:32 AM-Discussed treatment plan which includes a short course of Bactrim and warm compresses with pt at bedside and pt agreed to plan.   Labs Review Labs Reviewed - No data to display Imaging Review No results found.   EKG Interpretation None      MDM   Final diagnoses:  Abscess of left external cheek   Due to location and  small nature of abscess, I&D deferred.  No extension to eye to suggest orbital cellulitis, no visual impairment.  Pt started on short course abx, advised to continue warm compresses at home.  Discussed plan with pt, she acknowledged understand and agreed with plan of care.  Return precautions advised for new or worsening sx.  I personally performed the services described in this documentation, which was scribed in my presence. The recorded information has been reviewed and is accurate.  Garlon HatchetLisa M Lanaya Bennis, PA-C 04/28/13 1220

## 2013-04-29 NOTE — ED Provider Notes (Signed)
Medical screening examination/treatment/procedure(s) were performed by non-physician practitioner and as supervising physician I was immediately available for consultation/collaboration.   EKG Interpretation None       Juliet RudeNathan R. Rubin PayorPickering, MD 04/29/13 [phone]

## 2013-07-21 ENCOUNTER — Encounter (HOSPITAL_COMMUNITY): Payer: Self-pay | Admitting: Emergency Medicine

## 2013-07-21 ENCOUNTER — Emergency Department (HOSPITAL_COMMUNITY)
Admission: EM | Admit: 2013-07-21 | Discharge: 2013-07-21 | Disposition: A | Discharge status: Home / Self Care | Payer: Medicaid Other | Attending: Emergency Medicine | Admitting: Emergency Medicine

## 2013-07-21 DIAGNOSIS — IMO0001 Reserved for inherently not codable concepts without codable children: Secondary | ICD-10-CM | POA: Insufficient documentation

## 2013-07-21 DIAGNOSIS — M79609 Pain in unspecified limb: Secondary | ICD-10-CM | POA: Insufficient documentation

## 2013-07-21 DIAGNOSIS — F17200 Nicotine dependence, unspecified, uncomplicated: Secondary | ICD-10-CM | POA: Insufficient documentation

## 2013-07-21 DIAGNOSIS — M79661 Pain in right lower leg: Secondary | ICD-10-CM

## 2013-07-21 LAB — I-STAT CHEM 8, ED
BUN: 11 mg/dL (ref 6–23)
CREATININE: 0.9 mg/dL (ref 0.50–1.10)
Calcium, Ion: 1.17 mmol/L (ref 1.12–1.23)
Chloride: 108 mEq/L (ref 96–112)
GLUCOSE: 94 mg/dL (ref 70–99)
HEMATOCRIT: 43 % (ref 36.0–46.0)
HEMOGLOBIN: 14.6 g/dL (ref 12.0–15.0)
POTASSIUM: 3.9 meq/L (ref 3.7–5.3)
SODIUM: 139 meq/L (ref 137–147)
TCO2: 23 mmol/L (ref 0–100)

## 2013-07-21 MED ORDER — HYDROCODONE-ACETAMINOPHEN 5-325 MG PO TABS: 1.0000 | ORAL_TABLET | ORAL | Status: DC | PRN

## 2013-07-21 MED ORDER — HYDROCODONE-ACETAMINOPHEN 5-325 MG PO TABS
2.0000 | ORAL_TABLET | Freq: Once | ORAL | Status: AC
  Administered 2013-07-21: 2 via ORAL
  Filled 2013-07-21: qty 2

## 2013-07-21 MED ORDER — CYCLOBENZAPRINE HCL 10 MG PO TABS
10.0000 mg | ORAL_TABLET | Freq: Once | ORAL | Status: AC
  Administered 2013-07-21: 10 mg via ORAL
  Filled 2013-07-21: qty 1

## 2013-07-21 MED ORDER — CYCLOBENZAPRINE HCL 10 MG PO TABS: 10.0000 mg | ORAL_TABLET | Freq: Two times a day (BID) | ORAL | Status: DC | PRN

## 2013-07-21 NOTE — Discharge Instructions (Signed)
Take vicodin as needed for pain. Take flexeril as needed for muscle spasm. Refer to attached documents for more information. Return to the ED with worsening or concerning symptoms.

## 2013-07-21 NOTE — ED Provider Notes (Signed)
CSN: [id number]     Arrival date & time 07/21/13  0615 History   First MD Initiated Contact with Patient 07/21/13 0715     Chief Complaint  Patient presents with  . Leg Pain     (Consider location/radiation/quality/duration/timing/severity/associated sxs/prior Treatment) HPI Comments: Patient is a 33 year old female with no significant past medical history who presents with right calf pain that started suddenly in the middle of the night last night. Patient reports feeling like the muscle of her right calf was sunk inward, as if it were contracting. The pain was aching and severe and has associated shooting pains down to her toes and up to her thigh. After about 15 minutes, the pain started to improve. Palpation makes the pain worse. No alleviating factors. No other associated symptoms. No exogenous estrogen, recent surgery, long travel or immobilization.    History reviewed. No pertinent past medical history. Past Surgical History  Procedure Laterality Date  . Tubal ligation    . Hand surgery    . Knee surgery     No family history on file. History  Substance Use Topics  . Smoking status: Current Every Day Smoker  . Smokeless tobacco: Not on file  . Alcohol Use: Yes     Comment: occasional   OB History   Grav Para Term Preterm Abortions TAB SAB Ect Mult Living                 Review of Systems  Constitutional: Negative for fever, chills and fatigue.  HENT: Negative for trouble swallowing.   Eyes: Negative for visual disturbance.  Respiratory: Negative for shortness of breath.   Cardiovascular: Negative for chest pain and palpitations.  Gastrointestinal: Negative for nausea, vomiting, abdominal pain and diarrhea.  Genitourinary: Negative for dysuria and difficulty urinating.  Musculoskeletal: Positive for myalgias. Negative for arthralgias and neck pain.  Skin: Negative for color change.  Neurological: Negative for dizziness and weakness.  Psychiatric/Behavioral: Negative  for dysphoric mood.      Allergies  Review of patient's allergies indicates no known allergies.  Home Medications   Prior to Admission medications   Medication Sig Start Date End Date Taking? Authorizing Provider  Menthol, Topical Analgesic, (BIOFREEZE EX) Apply 1 application topically once.   Yes Historical Provider, MD   BP 110/76  Pulse 61  Temp(Src) 98.4 F (36.9 C) (Oral)  Resp 16  SpO2 97% Physical Exam  Nursing note and vitals reviewed. Constitutional: She is oriented to person, place, and time. She appears well-developed and well-nourished. No distress.  HENT:  Head: Normocephalic and atraumatic.  Eyes: Conjunctivae and EOM are normal.  Neck: Normal range of motion.  Cardiovascular: Normal rate, regular rhythm and intact distal pulses.  Exam reveals no gallop and no friction rub.   No murmur heard. Pulmonary/Chest: Effort normal and breath sounds normal. She has no wheezes. She has no rales. She exhibits no tenderness.  Musculoskeletal: Normal range of motion.  Mild right calf tenderness to palpation. No lower extremity edema.   Neurological: She is alert and oriented to person, place, and time. Coordination normal.  Speech is goal-oriented. Moves limbs without ataxia.   Skin: Skin is warm and dry.  Psychiatric: She has a normal mood and affect. Her behavior is normal.    ED Course  Procedures (including critical care time) Labs Review Labs Reviewed  I-STAT CHEM 8, ED    Imaging Review No results found.   EKG Interpretation None      MDM  Final diagnoses:  Right calf pain    7:38 AM Patient will have i-stat chem 8 to check electrolytes. Patient will have vicodin and flexeril here for pain. Vitals stable and patient afebrile. Distal pulses intact. Patient is PERC negative and has no clinical signs of DVT.   8:12 AM Patient's chem 8 is unremarkable for acute changes. Patient will be discharged with vicodin and flexeril and instructions to apply  heat to the affected area. Patient instructed to return with worsening or concerning symptoms.   Emilia Beck, PA-C 07/21/13 1016

## 2013-07-21 NOTE — ED Notes (Signed)
Pt. States that around 0500 the calf of her right leg started hurting. States she felt it and it felt like the muscle of her calf was sunk inward. She states that after about it began to resolve itself but she was left with sharp shooting pain from her toes up to her thigh on her right leg. Pt. Was able to walk into triage.

## 2013-07-21 NOTE — ED Provider Notes (Signed)
Medical screening examination/treatment/procedure(s) were performed by non-physician practitioner and as supervising physician I was immediately available for consultation/collaboration.   EKG Interpretation None        Marte Celani M Zaryia Markel, MD 07/21/13 1717 

## 2013-07-21 NOTE — ED Notes (Signed)
Pt A&OX4, ambulatory at d/c with steady gait but wheeled out into waiting room via wheelchair, NAD.

## 2013-09-02 ENCOUNTER — Emergency Department (HOSPITAL_COMMUNITY)
Admission: EM | Admit: 2013-09-02 | Discharge: 2013-09-02 | Disposition: A | Discharge status: Home / Self Care | Payer: Medicaid Other | Attending: Emergency Medicine | Admitting: Emergency Medicine

## 2013-09-02 ENCOUNTER — Encounter (HOSPITAL_COMMUNITY): Payer: Self-pay | Admitting: Emergency Medicine

## 2013-09-02 DIAGNOSIS — R059 Cough, unspecified: Secondary | ICD-10-CM

## 2013-09-02 DIAGNOSIS — R05 Cough: Secondary | ICD-10-CM

## 2013-09-02 DIAGNOSIS — R0602 Shortness of breath: Secondary | ICD-10-CM | POA: Insufficient documentation

## 2013-09-02 DIAGNOSIS — F17200 Nicotine dependence, unspecified, uncomplicated: Secondary | ICD-10-CM | POA: Insufficient documentation

## 2013-09-02 MED ORDER — ALBUTEROL SULFATE HFA 108 (90 BASE) MCG/ACT IN AERS
2.0000 | INHALATION_SPRAY | Freq: Once | RESPIRATORY_TRACT | Status: AC
  Administered 2013-09-02: 2 via RESPIRATORY_TRACT
  Filled 2013-09-02: qty 6.7

## 2013-09-02 NOTE — ED Notes (Signed)
Pt c/o cough that started this morning sts she hadn't had a cough or felt sick prior to this. Reports she is also having chest burning and only came here for that, sts the burning feeling has been constant, nothing makes it worse or better. Nad, skin warm and dry, resp e/u.

## 2013-09-02 NOTE — Discharge Instructions (Signed)
Cough, Adult   A cough is a reflex. It helps you clear your throat and airways. A cough can help heal your body. A cough can last 2 or 3 weeks (acute) or may last more than 8 weeks (chronic). Some common causes of a cough can include an infection, allergy, or a cold.  HOME CARE  · Only take medicine as told by your doctor.  · If given, take your medicines (antibiotics) as told. Finish them even if you start to feel better.  · Use a cold steam vaporizer or humidifier in your home. This can help loosen thick spit (secretions).  · Sleep so you are almost sitting up (semi-upright). Use pillows to do this. This helps reduce coughing.  · Rest as needed.  · Stop smoking if you smoke.  GET HELP RIGHT AWAY IF:  · You have yellowish-white fluid (pus) in your thick spit.  · Your cough gets worse.  · Your medicine does not reduce coughing, and you are losing sleep.  · You cough up blood.  · You have trouble breathing.  · Your pain gets worse and medicine does not help.  · You have a fever.  MAKE SURE YOU:   · Understand these instructions.  · Will watch your condition.  · Will get help right away if you are not doing well or get worse.  Document Released: 10/10/2010 Document Revised: 06/13/2013 Document Reviewed: 10/10/2010  [employer]® Patient Information ©2015 [employer]. This information is not intended to replace advice given to you by your health care provider. Make sure you discuss any questions you have with your health care provider.

## 2013-09-02 NOTE — ED Provider Notes (Signed)
CSN: [id number]     Arrival date & time 09/02/13  0746 History   First MD Initiated Contact with Patient 09/02/13 [phone]     Chief Complaint  Patient presents with  . Cough      Patient is a 33 y.o. female presenting with cough. The history is provided by the patient.  Cough Cough characteristics:  Non-productive Severity:  Moderate Onset quality:  Gradual Timing:  Intermittent Progression:  Worsening Chronicity:  New Smoker: yes   Relieved by:  None tried Worsened by:  Nothing tried Associated symptoms: shortness of breath   Associated symptoms: no fever and no sore throat   Associated symptoms comment:  Chest burning  PT reports she woke up with cough.  She reports due to cough she is having chest burning.  She denies fever.  No hemoptysis.  She reports SOB.   She had otherwise been well until this morning No sick contacts.  No travel.  No h/o DVT/PE.    PMH - none  Past Surgical History  Procedure Laterality Date  . Tubal ligation    . Hand surgery    . Knee surgery     No family history on file. History  Substance Use Topics  . Smoking status: Current Every Day Smoker  . Smokeless tobacco: Not on file  . Alcohol Use: Yes     Comment: occasional   OB History   Grav Para Term Preterm Abortions TAB SAB Ect Mult Living                 Review of Systems  Constitutional: Negative for fever.  HENT: Negative for sore throat.   Respiratory: Positive for cough and shortness of breath.       Allergies  Review of patient's allergies indicates no known allergies.  Home Medications   Prior to Admission medications   Not on File   BP 123/74  Pulse 83  Temp(Src) 98.6 F (37 C) (Oral)  Resp 16  SpO2 98%  LMP 08/10/2013 Physical Exam CONSTITUTIONAL: Well developed/well nourished, pt actively coughing during exam HEAD: Normocephalic/atraumatic EYES: EOMI/PERRL ENMT: Mucous membranes moist, nasal congestion NECK: supple no meningeal signs CV: S1/S2 noted,  no murmurs/rubs/gallops noted LUNGS: Lungs are clear to auscultation bilaterally, no apparent distress ABDOMEN: soft, nontender, no rebound or guarding GU:no cva tenderness NEURO: Pt is awake/alert, moves all extremitiesx4 EXTREMITIES: pulses normal, full ROM, no LE edema or tenderness noted SKIN: warm, color normal PSYCH: no abnormalities of mood noted  ED Course  Procedures    Pt with cough.  She is well appearing.  CP due to coughing.  Lung sounds clear, do not feel imaging or further workup warranted.  Offered albuterol MDI for symptomatic relief.  Pt aware she needs to quit smoking Doubt ACS/PE at this time.  No signs of acute CHF.  Clinically pt does not have pneumonia    Date: 09/02/2013  Rate: 87  Rhythm: normal sinus rhythm  QRS Axis: normal  Intervals: normal  ST/T Wave abnormalities: normal  Conduction Disutrbances:none   MDM   Final diagnoses:  Cough    Nursing notes including past medical history and social history reviewed and considered in documentation     Joya Gaskinsonald W Chord Takahashi, MD 09/02/13 [phone]

## 2013-09-02 NOTE — ED Notes (Signed)
Pt states that she woke this morning with a cough and sob. States that she has chest pain that radiates to rt side when she coughs or deep breaths. States that she has a headache as well.

## 2014-05-22 ENCOUNTER — Encounter (HOSPITAL_COMMUNITY): Payer: Self-pay

## 2014-05-22 ENCOUNTER — Emergency Department (HOSPITAL_COMMUNITY)
Admission: EM | Admit: 2014-05-22 | Discharge: 2014-05-23 | Disposition: A | Discharge status: Home / Self Care | Payer: Self-pay | Attending: Emergency Medicine | Admitting: Emergency Medicine

## 2014-05-22 DIAGNOSIS — K529 Noninfective gastroenteritis and colitis, unspecified: Secondary | ICD-10-CM | POA: Insufficient documentation

## 2014-05-22 DIAGNOSIS — R350 Frequency of micturition: Secondary | ICD-10-CM | POA: Insufficient documentation

## 2014-05-22 DIAGNOSIS — R109 Unspecified abdominal pain: Secondary | ICD-10-CM

## 2014-05-22 DIAGNOSIS — Z720 Tobacco use: Secondary | ICD-10-CM | POA: Insufficient documentation

## 2014-05-22 DIAGNOSIS — Z3202 Encounter for pregnancy test, result negative: Secondary | ICD-10-CM | POA: Insufficient documentation

## 2014-05-22 LAB — URINALYSIS, ROUTINE W REFLEX MICROSCOPIC
Bilirubin Urine: NEGATIVE
GLUCOSE, UA: NEGATIVE mg/dL
Hgb urine dipstick: NEGATIVE
Ketones, ur: 15 mg/dL — AB
Leukocytes, UA: NEGATIVE
Nitrite: NEGATIVE
PH: 6 (ref 5.0–8.0)
Protein, ur: NEGATIVE mg/dL
SPECIFIC GRAVITY, URINE: 1.027 (ref 1.005–1.030)
Urobilinogen, UA: 1 mg/dL (ref 0.0–1.0)

## 2014-05-22 LAB — COMPREHENSIVE METABOLIC PANEL
ALBUMIN: 4 g/dL (ref 3.5–5.2)
ALT: 13 U/L (ref 0–35)
AST: 18 U/L (ref 0–37)
Alkaline Phosphatase: 56 U/L (ref 39–117)
Anion gap: 9 (ref 5–15)
BUN: 11 mg/dL (ref 6–23)
CHLORIDE: 106 mmol/L (ref 96–112)
CO2: 25 mmol/L (ref 19–32)
CREATININE: 0.91 mg/dL (ref 0.50–1.10)
Calcium: 9 mg/dL (ref 8.4–10.5)
GFR calc Af Amer: >90 mL/min (ref >90)
GFR calc non Af Amer: 81 mL/min — ABNORMAL LOW (ref >90)
Glucose, Bld: 82 mg/dL (ref 70–99)
Potassium: 3.8 mmol/L (ref 3.5–5.1)
Sodium: 140 mmol/L (ref 135–145)
TOTAL PROTEIN: 6.7 g/dL (ref 6.0–8.3)
Total Bilirubin: 0.3 mg/dL (ref 0.3–1.2)

## 2014-05-22 LAB — CBC WITH DIFFERENTIAL/PLATELET
Basophils Absolute: 0 10*3/uL (ref 0.0–0.1)
Basophils Relative: 0 % (ref 0–1)
Eosinophils Absolute: 0.2 10*3/uL (ref 0.0–0.7)
Eosinophils Relative: 2 % (ref 0–5)
HCT: 37.3 % (ref 36.0–46.0)
HEMOGLOBIN: 12.2 g/dL (ref 12.0–15.0)
LYMPHS ABS: 2.9 10*3/uL (ref 0.7–4.0)
Lymphocytes Relative: 29 % (ref 12–46)
MCH: 26 pg (ref 26.0–34.0)
MCHC: 32.7 g/dL (ref 30.0–36.0)
MCV: 79.4 fL (ref 78.0–100.0)
MONOS PCT: 7 % (ref 3–12)
Monocytes Absolute: 0.7 10*3/uL (ref 0.1–1.0)
NEUTROS PCT: 62 % (ref 43–77)
Neutro Abs: 6.3 10*3/uL (ref 1.7–7.7)
Platelets: 258 10*3/uL (ref 150–400)
RBC: 4.7 MIL/uL (ref 3.87–5.11)
RDW: 13.3 % (ref 11.5–15.5)
WBC: 10 10*3/uL (ref 4.0–10.5)

## 2014-05-22 LAB — PREGNANCY, URINE: Preg Test, Ur: NEGATIVE

## 2014-05-22 LAB — LIPASE, BLOOD: Lipase: 30 U/L (ref 11–59)

## 2014-05-22 MED ORDER — OXYCODONE-ACETAMINOPHEN 5-325 MG PO TABS
1.0000 | ORAL_TABLET | Freq: Once | ORAL | Status: AC
  Administered 2014-05-22: 1 via ORAL
  Filled 2014-05-22: qty 1

## 2014-05-22 MED ORDER — ONDANSETRON 4 MG PO TBDP
8.0000 mg | ORAL_TABLET | Freq: Once | ORAL | Status: AC
  Administered 2014-05-22: 8 mg via ORAL
  Filled 2014-05-22: qty 2

## 2014-05-22 NOTE — ED Notes (Signed)
Pt. Reports around 1700 today began to have sharp, stabbing left flank pain. Reports nausea with hot flashes/chills. Reports increase in urination.

## 2014-05-22 NOTE — ED Notes (Signed)
Patient currently upset because she hasn't been seen by MD. Informed patient of reasons for delay. Patient remains unhappy. MD Nanavati aware.

## 2014-05-22 NOTE — ED Notes (Signed)
Patient verbally upset she has not been seen by doctor since arrival to ED. D Pod MD made aware but states he is "currently unable to see her at this time." patient updated.

## 2014-05-23 ENCOUNTER — Emergency Department (HOSPITAL_COMMUNITY): Payer: Medicaid Other

## 2014-05-23 MED ORDER — MORPHINE SULFATE 4 MG/ML IJ SOLN
4.0000 mg | Freq: Once | INTRAMUSCULAR | Status: AC
  Administered 2014-05-23: 4 mg via INTRAVENOUS
  Filled 2014-05-23: qty 1

## 2014-05-23 MED ORDER — ONDANSETRON 8 MG PO TBDP: 8.0000 mg | ORAL_TABLET | Freq: Three times a day (TID) | ORAL | Status: DC | PRN

## 2014-05-23 MED ORDER — SODIUM CHLORIDE 0.9 % IV BOLUS (SEPSIS)
500.0000 mL | Freq: Once | INTRAVENOUS | Status: AC
  Administered 2014-05-23: 500 mL via INTRAVENOUS

## 2014-05-23 NOTE — ED Provider Notes (Signed)
CSN: [id number]     Arrival date & time 05/22/14  2016 History   First MD Initiated Contact with Patient 05/23/14 0003     Chief Complaint  Patient presents with  . Flank Pain     (Consider location/radiation/quality/duration/timing/severity/associated sxs/prior Treatment) The history is provided by the patient and medical records. No language interpreter was used.     Kari Porter is a 34 y.o. female  with a hx of BTL presents to the Emergency Department complaining of gradual, persistent, progressively worsening left flank pain onset 5pm tonight. Associated symptoms include nausea, hot flashes, nausea and chills.  Patient reports that pain is sharp and stabbing without radiation. She reports that it onset of pain was 10/10 but has improved to 6/10 at this time. Nothing seems to make it better or worse. She denies fever or chills, headache, neck pain, chest pain, shortness of breath, abdominal pain, vomiting, diarrhea, weakness, dizziness, syncope or dysuria. Patient denies urgency however she reports urinary frequency since this morning. She denies history of kidney stones.  History reviewed. No pertinent past medical history. Past Surgical History  Procedure Laterality Date  . Tubal ligation    . Hand surgery    . Knee surgery     No family history on file. History  Substance Use Topics  . Smoking status: Current Every Day Smoker  . Smokeless tobacco: Not on file  . Alcohol Use: Yes     Comment: occasional   OB History    No data available     Review of Systems  Constitutional: Positive for chills. Negative for fever, diaphoresis, appetite change and fatigue.  Respiratory: Negative for shortness of breath.   Cardiovascular: Negative for chest pain.  Gastrointestinal: Positive for nausea. Negative for vomiting, abdominal pain, diarrhea, constipation and blood in stool.  Genitourinary: Positive for frequency and flank pain. Negative for dysuria, urgency, hematuria and  difficulty urinating.  Musculoskeletal: Negative for back pain.  Skin: Negative for rash.  Neurological: Negative for headaches.  All other systems reviewed and are negative.     Allergies  Review of patient's allergies indicates no known allergies.  Home Medications   Prior to Admission medications   Not on File   BP 113/81 mmHg  Pulse 79  Temp(Src) 99 F (37.2 C)  Resp 16  Wt 212 lb 7 oz (96.361 kg)  SpO2 97%  LMP 05/22/2014 Physical Exam  Constitutional: She appears well-developed and well-nourished. No distress.  Awake, alert, nontoxic appearance  HENT:  Head: Normocephalic and atraumatic.  Mouth/Throat: Oropharynx is clear and moist. No oropharyngeal exudate.  Eyes: Conjunctivae are normal. No scleral icterus.  Neck: Normal range of motion. Neck supple.  Cardiovascular: Normal rate, regular rhythm, normal heart sounds and intact distal pulses.   Pulmonary/Chest: Effort normal and breath sounds normal. No respiratory distress. She has no wheezes.  Equal chest expansion  Abdominal: Soft. Bowel sounds are normal. She exhibits no distension and no mass. There is no tenderness. There is CVA tenderness (left. mild). There is no rebound and no guarding.  Abdomen soft and nontender  Musculoskeletal: Normal range of motion. She exhibits no edema.  Neurological: She is alert.  Speech is clear and goal oriented Moves extremities without ataxia  Skin: Skin is warm and dry. No rash noted. She is not diaphoretic.  Psychiatric: She has a normal mood and affect.  Nursing note and vitals reviewed.   ED Course  Procedures (including critical care time) Labs Review Labs Reviewed  COMPREHENSIVE METABOLIC  PANEL - Abnormal; Notable for the following:    GFR calc non Af Amer 81 (*)    All other components within normal limits  URINALYSIS, ROUTINE W REFLEX MICROSCOPIC - Abnormal; Notable for the following:    Ketones, ur 15 (*)    All other components within normal limits  CBC  WITH DIFFERENTIAL/PLATELET  LIPASE, BLOOD  PREGNANCY, URINE    Imaging Review No results found.   EKG Interpretation None      MDM   Final diagnoses:  Flank pain    Kari Lennert presents with left flank pain onset 5pm tonight.  Pt denies hx of same.  Pt reports significant decrease in pain since she was given zofran and percocet in the waiting room.  Pt labs reassuring and no hgb in urine.  Will obtain CT renal to evaluate.  No abd pain.  Pt denies need for nausea medication at this time.    1:19 AM CT renal pending.  Pt may have stone. She is resting comfortably with adequate pain control.  No evidence of UTI on UA.  Abd is soft and nontender.  Labs reassuring.  Anticipate d/c home.  BP 113/81 mmHg  Pulse 79  Temp(Src) 99 F (37.2 C)  Resp 16  Wt 212 lb 7 oz (96.361 kg)  SpO2 97%  LMP 05/22/2014  Care transferred to Dr. Derwood Kaplan who will follow CT results and dispo accordingly.    Dahlia Client Thursa Emme, PA-C 05/23/14 0121  Derwood Kaplan, MD 05/23/14 2218

## 2014-05-23 NOTE — Discharge Instructions (Signed)
You have a stomach flu. Please hydrate well. Start with clear liquid diets. Take nausea meds as advised. Please return to the ER if your symptoms worsen; you have increased pain, fevers, chills, inability to keep any medications down, confusion, blood stools. Otherwise see the outpatient doctor as requested.  Viral Gastroenteritis Viral gastroenteritis is also known as stomach flu. This condition affects the stomach and intestinal tract. It can cause sudden diarrhea and vomiting. The illness typically lasts 3 to 8 days. Most people develop an immune response that eventually gets rid of the virus. While this natural response develops, the virus can make you quite ill. CAUSES  Many different viruses can cause gastroenteritis, such as rotavirus or noroviruses. You can catch one of these viruses by consuming contaminated food or water. You may also catch a virus by sharing utensils or other personal items with an infected person or by touching a contaminated surface. SYMPTOMS  The most common symptoms are diarrhea and vomiting. These problems can cause a severe loss of body fluids (dehydration) and a body salt (electrolyte) imbalance. Other symptoms may include:  Fever.  Headache.  Fatigue.  Abdominal pain. DIAGNOSIS  Your caregiver can usually diagnose viral gastroenteritis based on your symptoms and a physical exam. A stool sample may also be taken to test for the presence of viruses or other infections. TREATMENT  This illness typically goes away on its own. Treatments are aimed at rehydration. The most serious cases of viral gastroenteritis involve vomiting so severely that you are not able to keep fluids down. In these cases, fluids must be given through an intravenous line (IV). HOME CARE INSTRUCTIONS   Drink enough fluids to keep your urine clear or pale yellow. Drink small amounts of fluids frequently and increase the amounts as tolerated.  Ask your caregiver for specific rehydration  instructions.  Avoid:  Foods high in sugar.  Alcohol.  Carbonated drinks.  Tobacco.  Juice.  Caffeine drinks.  Extremely hot or cold fluids.  Fatty, greasy foods.  Too much intake of anything at one time.  Dairy products until 24 to 48 hours after diarrhea stops.  You may consume probiotics. Probiotics are active cultures of beneficial bacteria. They may lessen the amount and number of diarrheal stools in adults. Probiotics can be found in yogurt with active cultures and in supplements.  Wash your hands well to avoid spreading the virus.  Only take over-the-counter or prescription medicines for pain, discomfort, or fever as directed by your caregiver. Do not give aspirin to children. Antidiarrheal medicines are not recommended.  Ask your caregiver if you should continue to take your regular prescribed and over-the-counter medicines.  Keep all follow-up appointments as directed by your caregiver. SEEK IMMEDIATE MEDICAL CARE IF:   You are unable to keep fluids down.  You do not urinate at least once every 6 to 8 hours.  You develop shortness of breath.  You notice blood in your stool or vomit. This may look like coffee grounds.  You have abdominal pain that increases or is concentrated in one small area (localized).  You have persistent vomiting or diarrhea.  You have a fever.  The patient is a child younger than 3 months, and he or she has a fever.  The patient is a child older than 3 months, and he or she has a fever and persistent symptoms.  The patient is a child older than 3 months, and he or she has a fever and symptoms suddenly get worse.  The  patient is a baby, and he or she has no tears when crying. MAKE SURE YOU:   Understand these instructions.  Will watch your condition.  Will get help right away if you are not doing well or get worse. Document Released: 01/27/2005 Document Revised: 04/21/2011 Document Reviewed: 11/13/2010 [facility] Patient  Information 2015 [place], [place]. This information is not intended to replace advice given to you by your health care provider. Make sure you discuss any questions you have with your health care provider. Clear Liquid Diet A clear liquid diet is a short-term diet that is prescribed to provide the necessary fluid and basic energy you need when you can have nothing else. The clear liquid diet consists of liquids or solids that will become liquid at room temperature. You should be able to see through the liquid. There are many reasons that you may be restricted to clear liquids, such as:  When you have a sudden-onset (acute) condition that occurs before or after surgery.  To help your body slowly get adjusted to food again after a long period when you were unable to have food.  Replacement of fluids when you have a diarrheal disease.  When you are going to have certain exams, such as a colonoscopy, in which instruments are inserted inside your body to look at parts of your digestive system. WHAT CAN I HAVE? A clear liquid diet does not provide all the nutrients you need. It is important to choose a variety of the following items to get as many nutrients as possible:  Vegetable juices that do not have pulp.  Fruit juices and fruit drinks that do not have pulp.  Coffee (regular or decaffeinated), tea, or soda at the discretion of your health care provider.  Clear bouillon, broth, or strained broth-based soups.  High-protein and flavored gelatins.  Sugar or honey.  Ices or frozen ice pops that do not contain milk. If you are not sure whether you can have certain items, you should ask your health care provider. You may also ask your health care provider if there are any other clear liquid options. Document Released: 01/27/2005 Document Revised: 02/01/2013 Document Reviewed: 12/24/2012 [facility] Patient Information 2015 [place], [place]. This information is not intended to replace advice given to  you by your health care provider. Make sure you discuss any questions you have with your health care provider.

## 2014-06-19 ENCOUNTER — Emergency Department (HOSPITAL_COMMUNITY)
Admission: EM | Admit: 2014-06-19 | Discharge: 2014-06-19 | Discharge status: Against Medical Advice | Payer: Medicaid Other | Attending: Emergency Medicine | Admitting: Emergency Medicine

## 2014-06-19 ENCOUNTER — Encounter (HOSPITAL_COMMUNITY): Payer: Self-pay | Admitting: Emergency Medicine

## 2014-06-19 DIAGNOSIS — R112 Nausea with vomiting, unspecified: Secondary | ICD-10-CM | POA: Insufficient documentation

## 2014-06-19 DIAGNOSIS — R51 Headache: Secondary | ICD-10-CM | POA: Insufficient documentation

## 2014-06-19 DIAGNOSIS — Z720 Tobacco use: Secondary | ICD-10-CM | POA: Insufficient documentation

## 2014-06-19 NOTE — ED Notes (Signed)
Pt called to take back to a room and no answer

## 2014-06-19 NOTE — ED Notes (Signed)
Pt c/o frontal HA starting this am with N/V

## 2014-09-12 ENCOUNTER — Emergency Department (HOSPITAL_COMMUNITY)
Admission: EM | Admit: 2014-09-12 | Discharge: 2014-09-12 | Disposition: A | Discharge status: Home / Self Care | Payer: Self-pay | Attending: Emergency Medicine | Admitting: Emergency Medicine

## 2014-09-12 ENCOUNTER — Encounter (HOSPITAL_COMMUNITY): Payer: Self-pay | Admitting: *Deleted

## 2014-09-12 ENCOUNTER — Emergency Department (HOSPITAL_COMMUNITY): Payer: Medicaid Other

## 2014-09-12 DIAGNOSIS — Z720 Tobacco use: Secondary | ICD-10-CM | POA: Insufficient documentation

## 2014-09-12 DIAGNOSIS — M545 Low back pain: Secondary | ICD-10-CM | POA: Insufficient documentation

## 2014-09-12 MED ORDER — NAPROXEN 500 MG PO TABS: 500.0000 mg | ORAL_TABLET | Freq: Two times a day (BID) | ORAL | Status: DC

## 2014-09-12 MED ORDER — OXYCODONE-ACETAMINOPHEN 5-325 MG PO TABS: 2.0000 | ORAL_TABLET | ORAL | Status: DC | PRN

## 2014-09-12 MED ORDER — OXYCODONE-ACETAMINOPHEN 5-325 MG PO TABS
2.0000 | ORAL_TABLET | Freq: Once | ORAL | Status: AC
  Administered 2014-09-12: 2 via ORAL
  Filled 2014-09-12: qty 2

## 2014-09-12 MED ORDER — METHOCARBAMOL 500 MG PO TABS: 500.0000 mg | ORAL_TABLET | Freq: Two times a day (BID) | ORAL | Status: DC

## 2014-09-12 NOTE — Discharge Instructions (Signed)
Back Exercises Return for any bowel or bladder incontinence or retention. Follow up with [facility]. Back exercises help treat and prevent back injuries. The goal of back exercises is to increase the strength of your abdominal and back muscles and the flexibility of your back. These exercises should be started when you no longer have back pain. Back exercises include:  Pelvic Tilt. Lie on your back with your knees bent. Tilt your pelvis until the lower part of your back is against the floor. Hold this position 5 to 10 sec and repeat 5 to 10 times.  Knee to Chest. Pull first 1 knee up against your chest and hold for 20 to 30 seconds, repeat this with the other knee, and then both knees. This may be done with the other leg straight or bent, whichever feels better.  Sit-Ups or Curl-Ups. Bend your knees 90 degrees. Start with tilting your pelvis, and do a partial, slow sit-up, lifting your trunk only 30 to 45 degrees off the floor. Take at least 2 to 3 seconds for each sit-up. Do not do sit-ups with your knees out straight. If partial sit-ups are difficult, simply do the above but with only tightening your abdominal muscles and holding it as directed.  Hip-Lift. Lie on your back with your knees flexed 90 degrees. Push down with your feet and shoulders as you raise your hips a couple inches off the floor; hold for 10 seconds, repeat 5 to 10 times.  Back arches. Lie on your stomach, propping yourself up on bent elbows. Slowly press on your hands, causing an arch in your low back. Repeat 3 to 5 times. Any initial stiffness and discomfort should lessen with repetition over time.  Shoulder-Lifts. Lie face down with arms beside your body. Keep hips and torso pressed to floor as you slowly lift your head and shoulders off the floor. Do not overdo your exercises, especially in the beginning. Exercises may cause you some mild back discomfort which lasts for a few minutes; however, if the pain is more  severe, or lasts for more than 15 minutes, do not continue exercises until you see your caregiver. Improvement with exercise therapy for back problems is slow.  See your caregivers for assistance with developing a proper back exercise program. Document Released: 03/06/2004 Document Revised: 04/21/2011 Document Reviewed: 11/28/2010 [facility] Patient Information 2015 Porter, Kari. This information is not intended to replace advice given to you by your health care provider. Make sure you discuss any questions you have with your health care provider.

## 2014-09-12 NOTE — ED Notes (Signed)
Pt reports lower back pain that started today, denies injury. Pt is ambulatory at triage, states that pain increases with movement. Denies urinary symptoms.

## 2014-09-12 NOTE — ED Provider Notes (Signed)
CSN: [id number]     Arrival date & time 09/12/14  1831 History  This chart was scribed for non-physician practitioner, Catha Gosselin, PA-C working with Raeford Razor, MD by Placido Sou, ED scribe. This patient was seen in room TR06C/TR06C and the patient's care was started at 7:35 PM.   Chief Complaint  Patient presents with  . Back Pain   The history is provided by the patient. No language interpreter was used.    HPI Comments: Kari Porter is a 34 y.o. female who presents to the Emergency Department complaining of sudden, moderate, constant, central back pain with onset at 11:30 am this morning. Pt notes that she didn't experience any trauma and noticed a sudden onset of her back pain after standing from a sitting position while at work and further stresses that she did nothing abnormal today. She notes taking 6x tylenol and 2x muscle relaxers with her last dose of each around 1:00 PM and experienced no relief of her symptoms. Pt notes a hx of diffuse muscle spasms and denies that this pain is similar to these. She denies any hx of back issues, IV drug use, hx of cancer or heavy lifting. Pt denies any incontinence or retention of her bowels of bladder.   History reviewed. No pertinent past medical history. Past Surgical History  Procedure Laterality Date  . Tubal ligation    . Hand surgery    . Knee surgery     History reviewed. No pertinent family history. History  Substance Use Topics  . Smoking status: Current Every Day Smoker  . Smokeless tobacco: Not on file  . Alcohol Use: Yes     Comment: occasional   OB History    No data available     Review of Systems  Constitutional: Negative for fever.  Gastrointestinal:       Negative incontinence of bowels  Genitourinary:       Negative incontinence of bladder  Musculoskeletal: Positive for myalgias and back pain.  Skin: Negative for wound.  Neurological: Negative for weakness and numbness.    Allergies  Review of  patient's allergies indicates no known allergies.  Home Medications   Prior to Admission medications   Medication Sig Start Date End Date Taking? Authorizing Provider  methocarbamol (ROBAXIN) 500 MG tablet Take 1 tablet (500 mg total) by mouth 2 (two) times daily. 09/12/14   Disaya Walt Patel-Mills, PA-C  naproxen (NAPROSYN) 500 MG tablet Take 1 tablet (500 mg total) by mouth 2 (two) times daily. 09/12/14   Oak Dorey Patel-Mills, PA-C  ondansetron (ZOFRAN ODT) 8 MG disintegrating tablet Take 1 tablet (8 mg total) by mouth every 8 (eight) hours as needed for nausea. Patient not taking: Reported on 06/19/2014 05/23/14   Derwood Kaplan, MD  oxyCODONE-acetaminophen (PERCOCET/ROXICET) 5-325 MG per tablet Take 2 tablets by mouth every 4 (four) hours as needed for severe pain. 09/12/14   Eural Holzschuh Patel-Mills, PA-C   BP 101/62 mmHg  Pulse 79  Temp(Src) 98.6 F (37 C) (Oral)  Resp 19  Ht 5\' 7"  (1.702 m)  Wt 200 lb (90.719 kg)  BMI 31.32 kg/m2  SpO2 100%  LMP 09/02/2014 Physical Exam  Constitutional: She is oriented to person, place, and time. She appears well-developed and well-nourished. No distress.  HENT:  Head: Normocephalic and atraumatic.  Eyes: Conjunctivae and EOM are normal. Pupils are equal, round, and reactive to light.  Neck: Normal range of motion. Neck supple. No tracheal deviation present.  Cardiovascular: Normal rate.   Pulmonary/Chest: Effort normal.  No respiratory distress.  Abdominal: Soft.  Musculoskeletal: Normal range of motion.  No midline or paravertebral lumbar tenderness. No rash noted. 5/5 lower extremity strength. Able to dorsi and plantar flex without difficulty. No saddle anesthesia. Straight leg raise positive at 30 bilaterally.  Neurological: She is alert and oriented to person, place, and time.  Skin: Skin is warm and dry.  Psychiatric: She has a normal mood and affect. Her behavior is normal.  Nursing note and vitals reviewed.  ED Course  Procedures  DIAGNOSTIC  STUDIES: Oxygen Saturation is 98% on RA, normal by my interpretation.    COORDINATION OF CARE: 7:39 PM Discussed treatment plan with pt at bedside and pt agreed to plan.  Labs Review Labs Reviewed - No data to display  Imaging Review Dg Lumbar Spine 2-3 Views  09/12/2014   CLINICAL DATA:  34 year old female with sudden onset central lumbar back pain at 1130 hr. No known injury. Initial encounter.  EXAM: LUMBAR SPINE - 2-3 VIEW  COMPARISON:  CT Abdomen and Pelvis 05/23/2014.  FINDINGS: Normal lumbar segmentation. Straightening of lumbar lordosis, otherwise Stable and normal vertebral height and alignment. Relatively preserved disc spaces. Mild lower lumbar endplate spurring. Visible thoracic levels appear intact. Sacral ala and SI joints appear normal.  IMPRESSION: No acute osseous abnormality in the lumbar spine.   Electronically Signed   By: Odessa Fleming M.D.   On: 09/12/2014 20:31     EKG Interpretation None     Patient was given Robaxin, naproxen, and Percocet. MDM   Final diagnoses:  Low back pain without sciatica, unspecified back pain laterality   Patient presents for new onset back pain. She has no cauda equina symptoms. She has no numbness or weakness in her lower extremities. She denies any bowel or bladder incontinence or retention. I believe this is musculoskeletal related. Lumbar x-ray is negative for any acute abnormality. Medications  oxyCODONE-acetaminophen (PERCOCET/ROXICET) 5-325 MG per tablet 2 tablet (2 tablets Oral Given 09/12/14 1956)  I personally performed the services described in this documentation, which was scribed in my presence. The recorded information has been reviewed and is accurate.    Catha Gosselin, PA-C 09/12/14 2106  Raeford Razor, MD 09/13/14 [phone]

## 2014-11-09 ENCOUNTER — Emergency Department (HOSPITAL_COMMUNITY)
Admission: EM | Admit: 2014-11-09 | Discharge: 2014-11-09 | Disposition: A | Discharge status: Home / Self Care | Payer: Self-pay | Attending: Emergency Medicine | Admitting: Emergency Medicine

## 2014-11-09 ENCOUNTER — Emergency Department (HOSPITAL_COMMUNITY): Payer: Medicaid Other

## 2014-11-09 ENCOUNTER — Encounter (HOSPITAL_COMMUNITY): Payer: Self-pay | Admitting: Emergency Medicine

## 2014-11-09 DIAGNOSIS — A5903 Trichomonal cystitis and urethritis: Secondary | ICD-10-CM | POA: Insufficient documentation

## 2014-11-09 DIAGNOSIS — J069 Acute upper respiratory infection, unspecified: Secondary | ICD-10-CM | POA: Insufficient documentation

## 2014-11-09 DIAGNOSIS — Z720 Tobacco use: Secondary | ICD-10-CM | POA: Insufficient documentation

## 2014-11-09 DIAGNOSIS — R519 Headache, unspecified: Secondary | ICD-10-CM

## 2014-11-09 DIAGNOSIS — R51 Headache: Secondary | ICD-10-CM | POA: Insufficient documentation

## 2014-11-09 DIAGNOSIS — M436 Torticollis: Secondary | ICD-10-CM | POA: Insufficient documentation

## 2014-11-09 DIAGNOSIS — Z79899 Other long term (current) drug therapy: Secondary | ICD-10-CM | POA: Insufficient documentation

## 2014-11-09 LAB — CBC WITH DIFFERENTIAL/PLATELET
Basophils Absolute: 0 10*3/uL (ref 0.0–0.1)
Basophils Relative: 0 %
EOS ABS: 0.1 10*3/uL (ref 0.0–0.7)
Eosinophils Relative: 1 %
HCT: 36.6 % (ref 36.0–46.0)
HEMOGLOBIN: 12 g/dL (ref 12.0–15.0)
LYMPHS PCT: 12 %
Lymphs Abs: 2.1 10*3/uL (ref 0.7–4.0)
MCH: 25.8 pg — ABNORMAL LOW (ref 26.0–34.0)
MCHC: 32.8 g/dL (ref 30.0–36.0)
MCV: 78.7 fL (ref 78.0–100.0)
Monocytes Absolute: 1.4 10*3/uL — ABNORMAL HIGH (ref 0.1–1.0)
Monocytes Relative: 8 %
Neutro Abs: 13.8 10*3/uL — ABNORMAL HIGH (ref 1.7–7.7)
Neutrophils Relative %: 79 %
Platelets: 250 10*3/uL (ref 150–400)
RBC: 4.65 MIL/uL (ref 3.87–5.11)
RDW: 13.3 % (ref 11.5–15.5)
WBC: 17.4 10*3/uL — AB (ref 4.0–10.5)

## 2014-11-09 LAB — URINALYSIS, ROUTINE W REFLEX MICROSCOPIC
Bilirubin Urine: NEGATIVE
Glucose, UA: NEGATIVE mg/dL
KETONES UR: NEGATIVE mg/dL
LEUKOCYTES UA: NEGATIVE
NITRITE: NEGATIVE
PH: 6.5 (ref 5.0–8.0)
Protein, ur: NEGATIVE mg/dL
Specific Gravity, Urine: 1.019 (ref 1.005–1.030)
Urobilinogen, UA: 0.2 mg/dL (ref 0.0–1.0)

## 2014-11-09 LAB — URINE MICROSCOPIC-ADD ON

## 2014-11-09 LAB — BASIC METABOLIC PANEL
Anion gap: 8 (ref 5–15)
BUN: 8 mg/dL (ref 6–20)
CHLORIDE: 107 mmol/L (ref 101–111)
CO2: 24 mmol/L (ref 22–32)
Calcium: 9.2 mg/dL (ref 8.9–10.3)
Creatinine, Ser: 0.88 mg/dL (ref 0.44–1.00)
GFR calc Af Amer: >60 mL/min (ref >60)
GFR calc non Af Amer: >60 mL/min (ref >60)
Glucose, Bld: 87 mg/dL (ref 65–99)
POTASSIUM: 3.9 mmol/L (ref 3.5–5.1)
SODIUM: 139 mmol/L (ref 135–145)

## 2014-11-09 MED ORDER — LIDOCAINE HCL (PF) 1 % IJ SOLN
INTRAMUSCULAR | Status: AC
  Administered 2014-11-09: 5 mL
  Filled 2014-11-09: qty 5

## 2014-11-09 MED ORDER — FLUCONAZOLE 150 MG PO TABS: 150.0000 mg | ORAL_TABLET | Freq: Once | ORAL | Status: DC

## 2014-11-09 MED ORDER — METRONIDAZOLE 500 MG PO TABS
2000.0000 mg | ORAL_TABLET | Freq: Once | ORAL | Status: AC
  Administered 2014-11-09: 2000 mg via ORAL
  Filled 2014-11-09: qty 4

## 2014-11-09 MED ORDER — AZITHROMYCIN 250 MG PO TABS
1000.0000 mg | ORAL_TABLET | Freq: Once | ORAL | Status: AC
  Administered 2014-11-09: 1000 mg via ORAL
  Filled 2014-11-09: qty 4

## 2014-11-09 MED ORDER — FLUCONAZOLE 100 MG PO TABS
150.0000 mg | ORAL_TABLET | Freq: Once | ORAL | Status: AC
  Administered 2014-11-09: 150 mg via ORAL
  Filled 2014-11-09: qty 2

## 2014-11-09 MED ORDER — LIDOCAINE HCL (PF) 1 % IJ SOLN: 2.0000 mL | Freq: Once | INTRAMUSCULAR | Status: DC

## 2014-11-09 MED ORDER — KETOROLAC TROMETHAMINE 60 MG/2ML IM SOLN
60.0000 mg | Freq: Once | INTRAMUSCULAR | Status: AC
  Administered 2014-11-09: 60 mg via INTRAMUSCULAR
  Filled 2014-11-09: qty 2

## 2014-11-09 MED ORDER — CEFTRIAXONE SODIUM 250 MG IJ SOLR
250.0000 mg | Freq: Once | INTRAMUSCULAR | Status: AC
  Administered 2014-11-09: 250 mg via INTRAMUSCULAR
  Filled 2014-11-09: qty 250

## 2014-11-09 NOTE — ED Provider Notes (Signed)
CSN: [id number]     Arrival date & time 11/09/14  0848 History   First MD Initiated Contact with Patient 11/09/14 [phone]     Chief Complaint  Patient presents with  . Neck Pain     (Consider location/radiation/quality/duration/timing/severity/associated sxs/prior Treatment) HPI Comments: Patient presents to the ER for evaluation of multiple problems. Patient reports that she woke this morning not feeling well. She has had a dull posterior headache and pain in her neck. Pain is mostly on the right side and worsens when she turns her head to the right. She reports that she coughed earlier this morning and brought up sputum and there was a blood clot in it. She has not had any shortness of breath. There is no chest pain. She has not had any nausea, vomiting or diarrhea. She did have some pain across her upper abdomen earlier.  Patient is a 34 y.o. female presenting with neck pain.  Neck Pain   History reviewed. No pertinent past medical history. Past Surgical History  Procedure Laterality Date  . Tubal ligation    . Hand surgery    . Knee surgery     History reviewed. No pertinent family history. Social History  Substance Use Topics  . Smoking status: Current Every Day Smoker -- 0.00 packs/day for 20 years    Types: Cigarettes  . Smokeless tobacco: None  . Alcohol Use: Yes     Comment: occasional   OB History    No data available     Review of Systems  Respiratory: Positive for cough.   Gastrointestinal: Positive for abdominal pain.  Musculoskeletal: Positive for neck pain.  All other systems reviewed and are negative.     Allergies  Bee venom  Home Medications   Prior to Admission medications   Medication Sig Start Date End Date Taking? Authorizing Provider  Probiotic Product (PROBIOTIC ADVANCED PO) Take 3 capsules by mouth 3 (three) times daily.   Yes Historical Provider, MD  methocarbamol (ROBAXIN) 500 MG tablet Take 1 tablet (500 mg total) by mouth 2 (two) times  daily. Patient not taking: Reported on 11/09/2014 09/12/14   Catha Gosselin, PA-C  oxyCODONE-acetaminophen (PERCOCET/ROXICET) 5-325 MG per tablet Take 2 tablets by mouth every 4 (four) hours as needed for severe pain. 09/12/14   Hanna Patel-Mills, PA-C   BP 136/64 mmHg  Pulse 85  Temp(Src) 98.7 F (37.1 C) (Oral)  Resp 19  Ht  (1.702 m)  SpO2 99%  LMP 10/19/2014 Physical Exam  Constitutional: She is oriented to person, place, and time. She appears well-developed and well-nourished. No distress.  HENT:  Head: Normocephalic and atraumatic.  Right Ear: Hearing normal.  Left Ear: Hearing normal.  Nose: Nose normal.  Mouth/Throat: Oropharynx is clear and moist and mucous membranes are normal.  Eyes: Conjunctivae and EOM are normal. Pupils are equal, round, and reactive to light.  Neck: Normal range of motion. Neck supple. Muscular tenderness present. No Brudzinski's sign and no Kernig's sign noted.  Cardiovascular: Regular rhythm, S1 normal and S2 normal.  Exam reveals no gallop and no friction rub.   No murmur heard. Pulmonary/Chest: Effort normal and breath sounds normal. No respiratory distress. She exhibits no tenderness.  Abdominal: Soft. Normal appearance and bowel sounds are normal. There is no hepatosplenomegaly. There is no tenderness. There is no rebound, no guarding, no tenderness at McBurney's point and negative Murphy's sign. No hernia.  Musculoskeletal: Normal range of motion.  Neurological: She is alert and oriented to person, place,  and time. She has normal strength. No cranial nerve deficit or sensory deficit. Coordination normal. GCS eye subscore is 4. GCS verbal subscore is 5. GCS motor subscore is 6.  Skin: Skin is warm, dry and intact. No rash noted. No cyanosis.  Psychiatric: She has a normal mood and affect. Her speech is normal and behavior is normal. Thought content normal.  Nursing note and vitals reviewed.   ED Course  Procedures (including critical care  time) Labs Review Labs Reviewed  CBC WITH DIFFERENTIAL/PLATELET - Abnormal; Notable for the following:    WBC 17.4 (*)    MCH 25.8 (*)    Neutro Abs 13.8 (*)    Monocytes Absolute 1.4 (*)    All other components within normal limits  URINALYSIS, ROUTINE W REFLEX MICROSCOPIC (NOT AT [facility]) - Abnormal; Notable for the following:    APPearance CLOUDY (*)    Hgb urine dipstick SMALL (*)    All other components within normal limits  URINE MICROSCOPIC-ADD ON - Abnormal; Notable for the following:    Squamous Epithelial / LPF MANY (*)    Bacteria, UA MANY (*)    All other components within normal limits  BASIC METABOLIC PANEL    Imaging Review Dg Chest 2 View  11/09/2014   CLINICAL DATA:  Cough with 1 day history of blood in sputum  EXAM: CHEST  2 VIEW  COMPARISON:  March 26, 2006  FINDINGS: Lungs are clear. Heart size and pulmonary vascularity are normal. No adenopathy. No bone lesions.  IMPRESSION: No edema or consolidation.   Electronically Signed   By: Bretta Bang III M.D.   On: 11/09/2014 10:10   I have personally reviewed and evaluated these images and lab results as part of my medical decision-making.   EKG Interpretation None      MDM   Final diagnoses:  Headache, unspecified headache type  URI (upper respiratory infection)  Torticollis, acute  Trichomonal urethritis   Patient presents to the ER with multiple problems. Patient here primarily for evaluation of headache and upper respiratory infection symptoms. She also coughed up a blood clot in her mucus earlier today. She had complained of abdominal pain, but abdominal exam is benign. Vital signs are normal. Patient is not febrile. Neck examination reveals paraspinal muscle tenderness and resistance to turn her head to the right, but nothing to suggest meningitis. This is more consistent with muscle strain, spasm such as torticollis. Patient has normal neurologic function of upper extremities. Neurologic exam is  entirely normal.  Patient was administered Toradol here in the ER and has had resolution of her neck pain and headache. Chest x-ray was performed and there is no evidence of pneumonia or other lesions, symptoms most likely viral URI to be treated symptomatically.  Urinalysis does show trichomonas. She does report that she had unprotected sex this past weekend. She is not symptomatic. Will treat with Rocephin, Zithromax, Flagyl.    Gilda Crease, MD 11/09/14 1101

## 2014-11-09 NOTE — Discharge Instructions (Signed)
Torticollis, Acute You have suddenly (acutely) developed a twisted neck (torticollis). This is usually a self-limited condition. CAUSES  Acute torticollis may be caused by malposition, trauma or infection. Most commonly, acute torticollis is caused by sleeping in an awkward position. Torticollis may also be caused by the flexion, extension or twisting of the neck muscles beyond their normal position. Sometimes, the exact cause may not be known. SYMPTOMS  Usually, there is pain and limited movement of the neck. Your neck may twist to one side. DIAGNOSIS  The diagnosis is often made by physical examination. X-rays, CT scans or MRIs may be done if there is a history of trauma or concern of infection. TREATMENT  For a common, stiff neck that develops during sleep, treatment is focused on relaxing the contracted neck muscle. Medications (including shots) may be used to treat the problem. Most cases resolve in several days. Torticollis usually responds to conservative physical therapy. If left untreated, the shortened and spastic neck muscle can cause deformities in the face and neck. Rarely, surgery is required. HOME CARE INSTRUCTIONS   Use over-the-counter and prescription medications as directed by your caregiver.  Do stretching exercises and massage the neck as directed by your caregiver.  Follow up with physical therapy if needed and as directed by your caregiver. SEEK IMMEDIATE MEDICAL CARE IF:   You develop difficulty breathing or noisy breathing (stridor).  You drool, develop trouble swallowing or have pain with swallowing.  You develop numbness or weakness in the hands or feet.  You have changes in speech or vision.  You have problems with urination or bowel movements.  You have difficulty walking.  You have a fever.  You have increased pain. MAKE SURE YOU:   Understand these instructions.  Will watch your condition.  Will get help right away if you are not doing well or  get worse. Document Released: 01/25/2000 Document Revised: 04/21/2011 Document Reviewed: 03/07/2009 [facility] Patient Information 2015 [place], [place]. This information is not intended to replace advice given to you by your health care provider. Make sure you discuss any questions you have with your health care provider.  Trichomoniasis Trichomoniasis is an infection caused by an organism called Trichomonas. The infection can affect both women and men. In women, the outer female genitalia and the vagina are affected. In men, the penis is mainly affected, but the prostate and other reproductive organs can also be involved. Trichomoniasis is a sexually transmitted infection (STI) and is most often passed to another person through sexual contact.  RISK FACTORS  Having unprotected sexual intercourse.  Having sexual intercourse with an infected partner. SIGNS AND SYMPTOMS  Symptoms of trichomoniasis in women include:  Abnormal gray-green frothy vaginal discharge.  Itching and irritation of the vagina.  Itching and irritation of the area outside the vagina. Symptoms of trichomoniasis in men include:   Penile discharge with or without pain.  Pain during urination. This results from inflammation of the urethra. DIAGNOSIS  Trichomoniasis may be found during a Pap test or physical exam. Your health care provider may use one of the following methods to help diagnose this infection:  Examining vaginal discharge under a microscope. For men, urethral discharge would be examined.  Testing the pH of the vagina with a test tape.  Using a vaginal swab test that checks for the Trichomonas organism. A test is available that provides results within a few minutes.  Doing a culture test for the organism. This is not usually needed. TREATMENT   You may  be given medicine to fight the infection. Women should inform their health care provider if they could be or are pregnant. Some medicines used to treat the  infection should not be taken during pregnancy.  Your health care provider may recommend over-the-counter medicines or creams to decrease itching or irritation.  Your sexual partner will need to be treated if infected. HOME CARE INSTRUCTIONS   Take medicines only as directed by your health care provider.  Take over-the-counter medicine for itching or irritation as directed by your health care provider.  Do not have sexual intercourse while you have the infection.  Women should not douche or wear tampons while they have the infection.  Discuss your infection with your partner. Your partner may have gotten the infection from you, or you may have gotten it from your partner.  Have your sex partner get examined and treated if necessary.  Practice safe, informed, and protected sex.  See your health care provider for other STI testing. SEEK MEDICAL CARE IF:   You still have symptoms after you finish your medicine.  You develop abdominal pain.  You have pain when you urinate.  You have bleeding after sexual intercourse.  You develop a rash.  Your medicine makes you sick or makes you throw up (vomit). MAKE SURE YOU:  Understand these instructions.  Will watch your condition.  Will get help right away if you are not doing well or get worse. Document Released: 07/23/2000 Document Revised: 06/13/2013 Document Reviewed: 11/08/2012 [facility] Patient Information 2015 [place], [place]. This information is not intended to replace advice given to you by your health care provider. Make sure you discuss any questions you have with your health care provider.  Urinary Tract Infection Urinary tract infections (UTIs) can develop anywhere along your urinary tract. Your urinary tract is your body's drainage system for removing wastes and extra water. Your urinary tract includes two kidneys, two ureters, a bladder, and a urethra. Your kidneys are a pair of bean-shaped organs. Each kidney is about  the size of your fist. They are located below your ribs, one on each side of your spine. CAUSES Infections are caused by microbes, which are microscopic organisms, including fungi, viruses, and bacteria. These organisms are so small that they can only be seen through a microscope. Bacteria are the microbes that most commonly cause UTIs. SYMPTOMS  Symptoms of UTIs may vary by age and gender of the patient and by the location of the infection. Symptoms in young women typically include a frequent and intense urge to urinate and a painful, burning feeling in the bladder or urethra during urination. Older women and men are more likely to be tired, shaky, and weak and have muscle aches and abdominal pain. A fever may mean the infection is in your kidneys. Other symptoms of a kidney infection include pain in your back or sides below the ribs, nausea, and vomiting. DIAGNOSIS To diagnose a UTI, your caregiver will ask you about your symptoms. Your caregiver also will ask to provide a urine sample. The urine sample will be tested for bacteria and white blood cells. White blood cells are made by your body to help fight infection. TREATMENT  Typically, UTIs can be treated with medication. Because most UTIs are caused by a bacterial infection, they usually can be treated with the use of antibiotics. The choice of antibiotic and length of treatment depend on your symptoms and the type of bacteria causing your infection. HOME CARE INSTRUCTIONS  If you were prescribed  antibiotics, take them exactly as your caregiver instructs you. Finish the medication even if you feel better after you have only taken some of the medication. °· Drink enough water and fluids to keep your urine clear or pale yellow. °· Avoid caffeine, tea, and carbonated beverages. They tend to irritate your bladder. °· Empty your bladder often. Avoid holding urine for long periods of time. °· Empty your bladder before and after sexual  intercourse. °· After a bowel movement, women should cleanse from front to back. Use each tissue only once. °SEEK MEDICAL CARE IF:  °· You have back pain. °· You develop a fever. °· Your symptoms do not begin to resolve within 3 days. °SEEK IMMEDIATE MEDICAL CARE IF:  °· You have severe back pain or lower abdominal pain. °· You develop chills. °· You have nausea or vomiting. °· You have continued burning or discomfort with urination. °MAKE SURE YOU:  °· Understand these instructions. °· Will watch your condition. °· Will get help right away if you are not doing well or get worse. °Document Released: 11/06/2004 Document Revised: 07/29/2011 Document Reviewed: 03/07/2011 °[employer]® Patient Information ©2015 [employer]. This information is not intended to replace advice given to you by your health care provider. Make sure you discuss any questions you have with your health care provider. ° °

## 2014-11-09 NOTE — ED Notes (Signed)
Pt reports coughing up blood. Abdominal pain only when eating. Sore throat thinks its related to her attempt to cough up something. Pain across the upper abdomen.  No N/V/D. C/o neck pain that feels like throbbing. Night sweats last night.

## 2014-11-09 NOTE — Care Management Note (Signed)
Case Management Note  Patient Details  Name: Kari Porter MRN: [id number] Date of Birth: Jan 23, 1981  Subjective/Objective:                  34 y.o. female presenting with neck pain.//Home alone  Action/Plan: Follow for disposition needs.   Expected Discharge Date:      11/09/14            Expected Discharge Plan:  Home/Self Care  In-House Referral:  PCP / [facility]  Discharge planning Services  CM Consult, Follow-up appt scheduled, [facility] / [facility] (established/new)  Post Acute Care Choice:  NA Choice offered to:  NA  DME Arranged:  N/A DME Agency:  NA  HH Arranged:  NA HH Agency:  NA  Status of Service:  Completed, signed off  Medicare Important Message Given:    Date Medicare IM Given:    Medicare IM give by:    Date Additional Medicare IM Given:    Additional Medicare Important Message give by:     If discussed at [place] of Stay Meetings, dates discussed:    Additional Comments: Jamey Demchak J. Clydene Laming, RN, BSN, [place] [phone] ED CM consulted regarding PCP establishment and insurance enrollment. Pt presented to [facility] ED today with neck pain. NCM met with pt at bedside; pt confirms not having access to f/u care with PCP or insurance coverage. Discussed with patient importance and benefits of establishing PCP, and not utilizing the ED for primary care needs. Pt verbalized understanding and is in agreement. Discussed other options, provided list of local  affordable PCPs.  Pt voiced interest in the [facility] and [facility].  NCM advised that [facility]  Internal Medicine providers are seeing pts at [facility]. Pt verbalized understanding. NCM set up appointment with Sharon Seller, NP 10/17 at 2:15.  Fuller Mandril, RN 11/09/2014, 10:58 AM

## 2014-11-27 ENCOUNTER — Ambulatory Visit: Payer: Medicaid Other | Admitting: Family Medicine

## 2015-02-06 ENCOUNTER — Encounter (HOSPITAL_COMMUNITY): Payer: Self-pay | Admitting: Vascular Surgery

## 2015-02-06 ENCOUNTER — Emergency Department (HOSPITAL_COMMUNITY)
Admission: EM | Admit: 2015-02-06 | Discharge: 2015-02-06 | Disposition: A | Discharge status: Home / Self Care | Payer: Medicaid Other | Attending: Emergency Medicine | Admitting: Emergency Medicine

## 2015-02-06 DIAGNOSIS — Z79899 Other long term (current) drug therapy: Secondary | ICD-10-CM | POA: Insufficient documentation

## 2015-02-06 DIAGNOSIS — Z3202 Encounter for pregnancy test, result negative: Secondary | ICD-10-CM | POA: Insufficient documentation

## 2015-02-06 DIAGNOSIS — F17210 Nicotine dependence, cigarettes, uncomplicated: Secondary | ICD-10-CM | POA: Insufficient documentation

## 2015-02-06 DIAGNOSIS — N898 Other specified noninflammatory disorders of vagina: Secondary | ICD-10-CM | POA: Insufficient documentation

## 2015-02-06 LAB — WET PREP, GENITAL
Sperm: NONE SEEN
TRICH WET PREP: NONE SEEN
YEAST WET PREP: NONE SEEN

## 2015-02-06 LAB — I-STAT BETA HCG BLOOD, ED (MC, WL, AP ONLY): I-stat hCG, quantitative: <5 m[IU]/mL (ref <5)

## 2015-02-06 MED ORDER — ONDANSETRON 4 MG PO TBDP
8.0000 mg | ORAL_TABLET | Freq: Once | ORAL | Status: AC
  Administered 2015-02-06: 8 mg via ORAL
  Filled 2015-02-06: qty 2

## 2015-02-06 MED ORDER — CEFTRIAXONE SODIUM 250 MG IJ SOLR
250.0000 mg | Freq: Once | INTRAMUSCULAR | Status: AC
  Administered 2015-02-06: 250 mg via INTRAMUSCULAR
  Filled 2015-02-06: qty 250

## 2015-02-06 MED ORDER — METRONIDAZOLE 500 MG PO TABS
2000.0000 mg | ORAL_TABLET | Freq: Once | ORAL | Status: AC
  Administered 2015-02-06: 2000 mg via ORAL
  Filled 2015-02-06: qty 4

## 2015-02-06 MED ORDER — AZITHROMYCIN 250 MG PO TABS
1000.0000 mg | ORAL_TABLET | Freq: Once | ORAL | Status: AC
  Administered 2015-02-06: 1000 mg via ORAL
  Filled 2015-02-06: qty 4

## 2015-02-06 MED ORDER — LIDOCAINE HCL (PF) 1 % IJ SOLN
2.0000 mL | Freq: Once | INTRAMUSCULAR | Status: AC
  Administered 2015-02-06: 2 mL
  Filled 2015-02-06: qty 5

## 2015-02-06 NOTE — ED Provider Notes (Signed)
CSN: [id number]     Arrival date & time 02/06/15  1739 History  By signing my name below, I, Jarvis Morganaylor Ferguson, attest that this documentation has been prepared under the direction and in the presence of Trixie DredgeEmily Daran Favaro, PA-C Electronically Signed: Jarvis Morganaylor Ferguson, ED Scribe. 02/06/2015. 8:19 PM.    Chief Complaint  Patient presents with  . Foreign Body in Vagina   The history is provided by the patient. No language interpreter was used.    HPI Comments: Kari Porter is a 34 y.o. female who presents to the Emergency Department for evaluation of possible foreign body in her vagina for 4 days. Pt report she had had sex 4 days ago and the condom came off during intercourse and she states that she was not able to find the condom and has concern it may still be inside of her. She endorses she attempted to find the condom herself but was unable to find anything. She has not had any medications prior to arrival. Pt denies any aggravating/alleviating factors. Pt notes this was a new sexual partner. She admits she would like to have STI testing done since this was a new partner and the condom broke. She denies any fever, abdominal pain, nausea, vomiting, diarrhea, vaginal discharge, vaginal odor, bleeding, or vaginal pain.  History reviewed. No pertinent past medical history. Past Surgical History  Procedure Laterality Date  . Tubal ligation    . Hand surgery    . Knee surgery     No family history on file. Social History  Substance Use Topics  . Smoking status: Current Every Day Smoker -- 0.00 packs/day for 20 years    Types: Cigarettes  . Smokeless tobacco: None  . Alcohol Use: Yes     Comment: occasional   OB History    No data available     Review of Systems  Constitutional: Negative for fever and chills.  Gastrointestinal: Negative for nausea, vomiting, abdominal pain and diarrhea.  Genitourinary: Negative for dysuria, vaginal bleeding, vaginal discharge, vaginal pain, menstrual problem and  pelvic pain.  Musculoskeletal: Negative for myalgias.  Skin: Negative for rash and wound.  Allergic/Immunologic: Negative for immunocompromised state.  Hematological: Does not bruise/bleed easily.  Psychiatric/Behavioral: Negative for self-injury.      Allergies  Bee venom  Home Medications   Prior to Admission medications   Medication Sig Start Date End Date Taking? Authorizing Provider  fluconazole (DIFLUCAN) 150 MG tablet Take 1 tablet (150 mg total) by mouth once. 11/09/14   Gilda Creasehristopher J Pollina, MD  methocarbamol (ROBAXIN) 500 MG tablet Take 1 tablet (500 mg total) by mouth 2 (two) times daily. Patient not taking: Reported on 11/09/2014 09/12/14   Catha GosselinHanna Patel-Mills, PA-C  oxyCODONE-acetaminophen (PERCOCET/ROXICET) 5-325 MG per tablet Take 2 tablets by mouth every 4 (four) hours as needed for severe pain. 09/12/14   Hanna Patel-Mills, PA-C  Probiotic Product (PROBIOTIC ADVANCED PO) Take 3 capsules by mouth 3 (three) times daily.    Historical Provider, MD   Triage Vitals: BP 128/88 mmHg  Pulse 74  Temp(Src) 98.4 F (36.9 C) (Oral)  Resp 16  SpO2 99%   Physical Exam  Constitutional: She appears well-developed and well-nourished. No distress.  HENT:  Head: Normocephalic and atraumatic.  Eyes: Conjunctivae are normal.  Neck: Normal range of motion. Neck supple.  Pulmonary/Chest: Effort normal.  Abdominal: Soft. She exhibits no distension. There is no tenderness.  Genitourinary: No tenderness or bleeding in the vagina. No foreign body around the vagina. Vaginal discharge (small  amount; yellow) found.  Neurological: She is alert. She exhibits normal muscle tone.  Skin: She is not diaphoretic.  Psychiatric: She has a normal mood and affect. Her behavior is normal.  Nursing note and vitals reviewed.   ED Course  Procedures (including critical care time)  DIAGNOSTIC STUDIES: Oxygen Saturation is 99% on RA, normal by my interpretation.    COORDINATION OF CARE: 8:48 PM- Will  order STI testing. Pt advised of plan for treatment and pt agrees.  Labs Review Labs Reviewed - No data to display  Imaging Review No results found. I have personally reviewed and evaluated these lab results as part of my medical decision-making.   EKG Interpretation None      MDM   Final diagnoses:  Vaginal discharge    Afebrile, nontoxic patient with concern for vaginal FB and exposure to STDs.  There was no FB visible or palpable in the vagina.  Slight amount yellow discharge though pt is not symptomatic.  Pt requested empiric treatment for STDs- given flagyl, rocephin, azithromycin.   D/C home.  Discussed result, findings, treatment, and follow up  with patient.  Pt given return precautions.  Pt verbalizes understanding and agrees with plan.        I personally performed the services described in this documentation, which was scribed in my presence. The recorded information has been reviewed and is accurate.       Trixie Dredge, PA-C 02/06/15 2212  Loren Racer, MD 02/10/15 [phone]

## 2015-02-06 NOTE — ED Notes (Signed)
Pt reports to the ED for eval of possible FB in her vagina. She reports she had sex 4 days ago and the condom came off during intercourse and she believes it may still be inside of her. Pt denies any vaginal d/c or bleeding. She had some left sided abd pain and she took some medication and it resolved. Pt A&OX4, resp e/u, and skin warm and dry.

## 2015-02-06 NOTE — Discharge Instructions (Signed)
Read the information below.  You may return to the Emergency Department at any time for worsening condition or any new symptoms that concern you. °

## 2015-02-07 LAB — RPR: RPR Ser Ql: NONREACTIVE

## 2015-02-07 LAB — HIV ANTIBODY (ROUTINE TESTING W REFLEX): HIV SCREEN 4TH GENERATION: NONREACTIVE

## 2015-02-10 LAB — GC/CHLAMYDIA PROBE AMP (~~LOC~~) NOT AT ARMC
CHLAMYDIA, DNA PROBE: NEGATIVE
Neisseria Gonorrhea: POSITIVE — AB

## 2015-02-12 ENCOUNTER — Telehealth (HOSPITAL_COMMUNITY): Payer: Self-pay

## 2015-02-12 NOTE — Telephone Encounter (Signed)
Spoke with pt. Verified ID. Informed of labs. Treated per protocol. DHHS form faxed. Pt informed to abstain from sexual activity x 10 days and to notify partner for testing and treatment.  

## 2015-02-13 ENCOUNTER — Inpatient Hospital Stay (HOSPITAL_COMMUNITY)
Admission: AD | Admit: 2015-02-13 | Discharge: 2015-02-13 | Disposition: A | Discharge status: Home / Self Care | Payer: Self-pay | Source: Ambulatory Visit | Attending: Obstetrics & Gynecology | Admitting: Obstetrics & Gynecology

## 2015-02-13 ENCOUNTER — Encounter (HOSPITAL_COMMUNITY): Payer: Self-pay | Admitting: *Deleted

## 2015-02-13 DIAGNOSIS — F17210 Nicotine dependence, cigarettes, uncomplicated: Secondary | ICD-10-CM | POA: Insufficient documentation

## 2015-02-13 DIAGNOSIS — A549 Gonococcal infection, unspecified: Secondary | ICD-10-CM

## 2015-02-13 DIAGNOSIS — Z202 Contact with and (suspected) exposure to infections with a predominantly sexual mode of transmission: Secondary | ICD-10-CM | POA: Insufficient documentation

## 2015-02-13 HISTORY — DX: Other specified health status: Z78.9

## 2015-02-13 LAB — URINALYSIS, ROUTINE W REFLEX MICROSCOPIC
Bilirubin Urine: NEGATIVE
GLUCOSE, UA: NEGATIVE mg/dL
Ketones, ur: 15 mg/dL — AB
LEUKOCYTES UA: NEGATIVE
Nitrite: NEGATIVE
PH: 6 (ref 5.0–8.0)
PROTEIN: NEGATIVE mg/dL
SPECIFIC GRAVITY, URINE: 1.025 (ref 1.005–1.030)

## 2015-02-13 LAB — URINE MICROSCOPIC-ADD ON

## 2015-02-13 LAB — POCT PREGNANCY, URINE: Preg Test, Ur: NEGATIVE

## 2015-02-13 MED ORDER — AZITHROMYCIN 250 MG PO TABS
1000.0000 mg | ORAL_TABLET | Freq: Once | ORAL | Status: AC
  Administered 2015-02-13: 1000 mg via ORAL
  Filled 2015-02-13: qty 4

## 2015-02-13 MED ORDER — CEFTRIAXONE SODIUM 250 MG IJ SOLR
250.0000 mg | Freq: Once | INTRAMUSCULAR | Status: AC
  Administered 2015-02-13: 250 mg via INTRAMUSCULAR
  Filled 2015-02-13: qty 250

## 2015-02-13 NOTE — Discharge Instructions (Signed)
Gonorrhea Gonorrhea is an infection that can cause serious problems. If left untreated, the infection may:   Damage the female or female organs.   Cause women to be unable to have children (sterility).   Harm a fetus if the infected woman is pregnant.  It is important to get treatment for gonorrhea as soon as possible. It is also necessary that all your sexual partners be tested for the infection.  CAUSES  Gonorrhea is caused by bacteria called Neisseria gonorrhoeae. The infection is spread from person to person, usually by sexual contact (such as by anal, vaginal, or oral means). A newborn can contract the infection from his or her mother during birth.  RISK FACTORS  Being a woman younger than 37 years of age who is sexually active.  Being a woman 25 years of age or older who has:  A new sex partner.  More than one sex partner.  A sex partner who has a sexually transmitted disease (STD).  Using condoms inconsistently.  Currently having, or having previously had, an STD.  Exchanging sex or money or drugs. SYMPTOMS  Some people with gonorrhea do not have symptoms. Symptoms may be different in females and males.  Females The most common symptoms are:   Pain in the lower abdomen.   Fever with or without chills.  Other symptoms include:   Abnormal vaginal discharge.   Painful intercourse.   Burning or itching of the vagina or lips of the vagina.   Abnormal vaginal bleeding.   Pain when urinating.   Long-lasting (chronic) pain in the lower abdomen, especially during menstruation or intercourse.   Inability to become pregnant.   Going into premature labor.   Irritation, pain, bleeding, or discharge from the rectum. This may occur if the infection was spread by anal sex.   Sore throat or swollen lymph nodes in the neck. This may occur if the infection was spread by oral sex.  Males The most common symptoms are:   Discharge from the penis.   Pain  or burning during urination.   Pain or swelling in the testicles. Other symptoms may include:   Irritation, pain, bleeding, or discharge from the rectum. This may occur if the infection was spread by anal sex.   Sore throat, fever, or swollen lymph nodes in the neck. This may occur if the infection was spread by oral sex.  DIAGNOSIS  A diagnosis is made after a physical exam is done and a sample of discharge is examined under a microscope for the presence of the bacteria. The discharge may be taken from the urethra, cervix, throat, or rectum.  TREATMENT  Gonorrhea is treated with antibiotic medicines. It is important for treatment to begin as soon as possible. Early treatment may prevent some problems from developing. Do not have sex. Avoid all types of sexual activity for 7 days after treatment is complete and until any sex partners have been treated. HOME CARE INSTRUCTIONS   Take medicines only as directed by your health care provider.   Take your antibiotic medicine as directed by your health care provider. Finish the antibiotic even if you start to feel better. Incomplete treatment will put you at risk for continued infection.   Do not have sex until treatment is complete or as directed by your health care provider.   Keep all follow-up visits as directed by your health care provider.   Not all test results are available during your visit. If your test results are not back   during the visit, make an appointment with your health care provider to find out the results. Do not assume everything is normal if you have not heard from your health care provider or the medical facility. It is your responsibility to get your test results.  If you test positive for gonorrhea, inform your recent sexual partners. They need to be checked for gonorrhea even if they do not have symptoms. They may need treatment, even if they test negative for gonorrhea.  SEEK MEDICAL CARE IF:   You develop any  bad reaction to the medicine you were prescribed. This may include:   A rash.   Nausea.   Vomiting.   Diarrhea.   Your symptoms do not improve after a few days of taking antibiotics.   Your symptoms get worse.   You develop increased pain, such as in the testicles (for males) or in the abdomen (for females).  You have a fever. MAKE SURE YOU:   Understand these instructions.  Will watch your condition.  Will get help right away if you are not doing well or get worse.   This information is not intended to replace advice given to you by your health care provider. Make sure you discuss any questions you have with your health care provider.   Document Released: 01/25/2000 Document Revised: 02/17/2014 Document Reviewed: 08/04/2012 Elsevier Interactive Patient Education 2016 Elsevier Inc.  

## 2015-02-13 NOTE — MAU Note (Signed)
Pt states she had intercourse last night and does not think that that condom came out-never saw it. Wants to be checked for STD's as well. Pt denies pain or discharge. LMP: 01/28/2015. Denies urinary s/s.

## 2015-02-13 NOTE — MAU Provider Note (Signed)
History     CSN: [id number]  Arrival date and time: 02/13/15 2101   First Provider Initiated Contact with Patient 02/13/15 2239      Chief Complaint  Patient presents with  . Exposure to STD   HPI Kari Porter is a 35 y.o. [id number] who presents to MAU today with complaint of possible STD exposure. [facility]. She was treated for possible GC/Chlamydia at that time. She was told that her cultures were negative and had sex with the same partner again yesterday. Today she received a MyChart notice that her GC test was positive. She states that the condom fell off last night and she wants to make sure it is not stuck in her vagina. She denies abnormal discharge, bleeding or discomfort.   OB History    Gravida Para Term Preterm AB TAB SAB Ectopic Multiple Living   6 2 2  4 4    2       Past Medical History  Diagnosis Date  . Medical history non-contributory     Past Surgical History  Procedure Laterality Date  . Tubal ligation    . Hand surgery    . Knee surgery      History reviewed. No pertinent family history.  Social History  Substance Use Topics  . Smoking status: Current Every Day Smoker -- 0.00 packs/day for 20 years    Types: Cigarettes  . Smokeless tobacco: None  . Alcohol Use: Yes     Comment: occasional    Allergies:  Allergies  Allergen Reactions  . Bee Venom     No prescriptions prior to admission    Review of Systems  Constitutional: Negative for fever and malaise/fatigue.  Gastrointestinal: Negative for nausea, vomiting, abdominal pain, diarrhea and constipation.  Genitourinary: Negative for dysuria, urgency and frequency.       Neg - vaginal bleeding, discharge   Physical Exam   Blood pressure 119/83, pulse 85, temperature 98.4 F (36.9 C), temperature source Oral, resp. rate 16, height 5' 6.8" (1.697 m), weight 195 lb 12.8 oz (88.814 kg), last menstrual period 01/28/2015, SpO2 99 %.  Physical Exam  Nursing note  and vitals reviewed. Constitutional: She is oriented to person, place, and time. She appears well-developed and well-nourished. No distress.  HENT:  Head: Normocephalic and atraumatic.  Cardiovascular: Normal rate.   Respiratory: Effort normal.  GI: Soft. She exhibits no distension and no mass. There is no tenderness. There is no rebound and no guarding.  Genitourinary: No bleeding in the vagina. Vaginal discharge (small amount of thick, white discharge noted) found.  No retained condom noted or palpated  Neurological: She is alert and oriented to person, place, and time.  Skin: Skin is warm and dry. No erythema.  Psychiatric: She has a normal mood and affect.    Results for orders placed or performed during the hospital encounter of 02/13/15 (from the past 24 hour(s))  Urinalysis, Routine w reflex microscopic (not at [facility])     Status: Abnormal   Collection Time: 02/13/15  9:15 PM  Result Value Ref Range   Color, Urine YELLOW YELLOW   APPearance CLEAR CLEAR   Specific Gravity, Urine 1.025 1.005 - 1.030   pH 6.0 5.0 - 8.0   Glucose, UA NEGATIVE NEGATIVE mg/dL   Hgb urine dipstick SMALL (A) NEGATIVE   Bilirubin Urine NEGATIVE NEGATIVE   Ketones, ur 15 (A) NEGATIVE mg/dL   Protein, ur NEGATIVE NEGATIVE mg/dL   Nitrite NEGATIVE  NEGATIVE   Leukocytes, UA NEGATIVE NEGATIVE  Urine microscopic-add on     Status: Abnormal   Collection Time: 02/13/15  9:15 PM  Result Value Ref Range   Squamous Epithelial / LPF 0-5 (A) NONE SEEN   WBC, UA 0-5 0 - 5 WBC/hpf   RBC / HPF 0-5 0 - 5 RBC/hpf   Bacteria, UA RARE (A) NONE SEEN  Pregnancy, urine POC     Status: None   Collection Time: 02/13/15  9:27 PM  Result Value Ref Range   Preg Test, Ur NEGATIVE NEGATIVE    MAU Course  Procedures None  MDM UPT - negative UA today 250 mg Rocephin and 1000 mg Zithromax given in MAU  Assessment and Plan  A: STD exposure  P Discharge home Partner treatment advised Warning signs for worsening  condition discussed Patient advised to follow-up with [facility] as needed for further STD check/treatment Patient may return to MAU as needed or if her condition were to change or worsen   Marny Lowenstein, PA-C  02/14/2015, 12:21 AM

## 2015-07-16 ENCOUNTER — Emergency Department (HOSPITAL_COMMUNITY)
Admission: EM | Admit: 2015-07-16 | Discharge: 2015-07-16 | Disposition: A | Discharge status: Home / Self Care | Payer: Self-pay | Attending: Emergency Medicine | Admitting: Emergency Medicine

## 2015-07-16 ENCOUNTER — Emergency Department (HOSPITAL_COMMUNITY): Payer: Self-pay

## 2015-07-16 ENCOUNTER — Encounter (HOSPITAL_COMMUNITY): Payer: Self-pay | Admitting: Emergency Medicine

## 2015-07-16 DIAGNOSIS — R0789 Other chest pain: Secondary | ICD-10-CM | POA: Insufficient documentation

## 2015-07-16 DIAGNOSIS — R197 Diarrhea, unspecified: Secondary | ICD-10-CM | POA: Insufficient documentation

## 2015-07-16 DIAGNOSIS — F17210 Nicotine dependence, cigarettes, uncomplicated: Secondary | ICD-10-CM | POA: Insufficient documentation

## 2015-07-16 DIAGNOSIS — H578 Other specified disorders of eye and adnexa: Secondary | ICD-10-CM | POA: Insufficient documentation

## 2015-07-16 DIAGNOSIS — J069 Acute upper respiratory infection, unspecified: Secondary | ICD-10-CM | POA: Insufficient documentation

## 2015-07-16 DIAGNOSIS — B9789 Other viral agents as the cause of diseases classified elsewhere: Secondary | ICD-10-CM

## 2015-07-16 LAB — CBC WITH DIFFERENTIAL/PLATELET
BASOS ABS: 0 10*3/uL (ref 0.0–0.1)
Basophils Relative: 0 %
EOS PCT: 2 %
Eosinophils Absolute: 0.2 10*3/uL (ref 0.0–0.7)
HCT: 35.5 % — ABNORMAL LOW (ref 36.0–46.0)
Hemoglobin: 11.6 g/dL — ABNORMAL LOW (ref 12.0–15.0)
LYMPHS PCT: 18 %
Lymphs Abs: 1.9 10*3/uL (ref 0.7–4.0)
MCH: 25.4 pg — ABNORMAL LOW (ref 26.0–34.0)
MCHC: 32.7 g/dL (ref 30.0–36.0)
MCV: 77.7 fL — AB (ref 78.0–100.0)
MONO ABS: 1.1 10*3/uL — AB (ref 0.1–1.0)
Monocytes Relative: 10 %
Neutro Abs: 7.7 10*3/uL (ref 1.7–7.7)
Neutrophils Relative %: 70 %
PLATELETS: 247 10*3/uL (ref 150–400)
RBC: 4.57 MIL/uL (ref 3.87–5.11)
RDW: 12.9 % (ref 11.5–15.5)
WBC: 10.9 10*3/uL — ABNORMAL HIGH (ref 4.0–10.5)

## 2015-07-16 LAB — BASIC METABOLIC PANEL
Anion gap: 6 (ref 5–15)
BUN: 8 mg/dL (ref 6–20)
CO2: 25 mmol/L (ref 22–32)
Calcium: 9.1 mg/dL (ref 8.9–10.3)
Chloride: 106 mmol/L (ref 101–111)
Creatinine, Ser: 0.87 mg/dL (ref 0.44–1.00)
GFR calc Af Amer: >60 mL/min (ref >60)
GFR calc non Af Amer: >60 mL/min (ref >60)
GLUCOSE: 78 mg/dL (ref 65–99)
POTASSIUM: 4.3 mmol/L (ref 3.5–5.1)
Sodium: 137 mmol/L (ref 135–145)

## 2015-07-16 MED ORDER — FLUTICASONE PROPIONATE 50 MCG/ACT NA SUSP: 2.0000 | Freq: Every day | NASAL | Status: AC

## 2015-07-16 MED ORDER — GUAIFENESIN ER 600 MG PO TB12: 600.0000 mg | ORAL_TABLET | Freq: Two times a day (BID) | ORAL | Status: AC

## 2015-07-16 MED ORDER — IBUPROFEN 200 MG PO TABS
600.0000 mg | ORAL_TABLET | Freq: Once | ORAL | Status: AC
  Administered 2015-07-16: 600 mg via ORAL
  Filled 2015-07-16: qty 3

## 2015-07-16 NOTE — ED Notes (Signed)
cough  and chest pain  Since sat some sob no n/v has congested cough

## 2015-07-16 NOTE — Discharge Instructions (Signed)
Start using Flonase daily to help with nasal congestion and postnasal drip Start using Mucinex 600 mg twice daily to help loosen up and dry out the drainage Continue to use ibuprofen as needed for pain and discomfort He continued have sore throats, and gargle with salt water or use cough lozenges You can return to work tomorrow, however I would suggest wearing a mask if you continue to cough and sneeze.    Upper Respiratory Infection, Adult Most upper respiratory infections (URIs) are a viral infection of the air passages leading to the lungs. A URI affects the nose, throat, and upper air passages. The most common type of URI is nasopharyngitis and is typically referred to as "the common cold." URIs run their course and usually go away on their own. Most of the time, a URI does not require medical attention, but sometimes a bacterial infection in the upper airways can follow a viral infection. This is called a secondary infection. Sinus and middle ear infections are common types of secondary upper respiratory infections. Bacterial pneumonia can also complicate a URI. A URI can worsen asthma and chronic obstructive pulmonary disease (COPD). Sometimes, these complications can require emergency medical care and may be life threatening.  CAUSES Almost all URIs are caused by viruses. A virus is a type of germ and can spread from one person to another.  RISKS FACTORS You may be at risk for a URI if:   You smoke.   You have chronic heart or lung disease.  You have a weakened defense (immune) system.   You are very young or very old.   You have nasal allergies or asthma.  You work in crowded or poorly ventilated areas.  You work in health care facilities or schools. SIGNS AND SYMPTOMS  Symptoms typically develop 2-3 days after you come in contact with a cold virus. Most viral URIs last 7-10 days. However, viral URIs from the influenza virus (flu virus) can last 14-18 days and are typically  more severe. Symptoms may include:   Runny or stuffy (congested) nose.   Sneezing.   Cough.   Sore throat.   Headache.   Fatigue.   Fever.   Loss of appetite.   Pain in your forehead, behind your eyes, and over your cheekbones (sinus pain).  Muscle aches.  DIAGNOSIS  Your health care provider may diagnose a URI by:  Physical exam.  Tests to check that your symptoms are not due to another condition such as:  Strep throat.  Sinusitis.  Pneumonia.  Asthma. TREATMENT  A URI goes away on its own with time. It cannot be cured with medicines, but medicines may be prescribed or recommended to relieve symptoms. Medicines may help:  Reduce your fever.  Reduce your cough.  Relieve nasal congestion. HOME CARE INSTRUCTIONS   Take medicines only as directed by your health care provider.   Gargle warm saltwater or take cough drops to comfort your throat as directed by your health care provider.  Use a warm mist humidifier or inhale steam from a shower to increase air moisture. This may make it easier to breathe.  Drink enough fluid to keep your urine clear or pale yellow.   Eat soups and other clear broths and maintain good nutrition.   Rest as needed.   Return to work when your temperature has returned to normal or as your health care provider advises. You may need to stay home longer to avoid infecting others. You can also use a face  mask and careful hand washing to prevent spread of the virus.  Increase the usage of your inhaler if you have asthma.   Do not use any tobacco products, including cigarettes, chewing tobacco, or electronic cigarettes. If you need help quitting, ask your health care provider. PREVENTION  The best way to protect yourself from getting a cold is to practice good hygiene.   Avoid oral or hand contact with people with cold symptoms.   Wash your hands often if contact occurs.  There is no clear evidence that vitamin C,  vitamin E, echinacea, or exercise reduces the chance of developing a cold. However, it is always recommended to get plenty of rest, exercise, and practice good nutrition.  SEEK MEDICAL CARE IF:   You are getting worse rather than better.   Your symptoms are not controlled by medicine.   You have chills.  You have worsening shortness of breath.  You have brown or red mucus.  You have yellow or brown nasal discharge.  You have pain in your face, especially when you bend forward.  You have a fever.  You have swollen neck glands.  You have pain while swallowing.  You have white areas in the back of your throat. SEEK IMMEDIATE MEDICAL CARE IF:   You have severe or persistent:  Headache.  Ear pain.  Sinus pain.  Chest pain.  You have chronic lung disease and any of the following:  Wheezing.  Prolonged cough.  Coughing up blood.  A change in your usual mucus.  You have a stiff neck.  You have changes in your:  Vision.  Hearing.  Thinking.  Mood. MAKE SURE YOU:   Understand these instructions.  Will watch your condition.  Will get help right away if you are not doing well or get worse.   This information is not intended to replace advice given to you by your health care provider. Make sure you discuss any questions you have with your health care provider.   Document Released: 07/23/2000 Document Revised: 06/13/2014 Document Reviewed: 05/04/2013 Elsevier Interactive Patient Education [employer]2016 Elsevier Inc.

## 2015-07-16 NOTE — ED Notes (Signed)
Pt verbalized understanding of d/c instructions, prescriptions, and follow-up care. No further questions/concerns, VSS, ambulatory w/ steady gait (refused wheelchair) 

## 2015-07-16 NOTE — ED Provider Notes (Signed)
CSN: [id number]     Arrival date & time 07/16/15  4098 History   First MD Initiated Contact with Patient 07/16/15 [phone]     Chief Complaint  Patient presents with  . Cough  . Chest Pain    (Consider location/radiation/quality/duration/timing/severity/associated sxs/prior Treatment) HPI  The patient has had a rhinorrhea, cough that is inermittently productive , scratchy throat, sneezing, chest pain with coughing, diffuse myalagias, watery eyes since Saturday. Of note, she did have diarrhea started on Friday. Her stools are now starting to be more formed. No fevers. Intermittently having frontal headaches, no facial pain. Endorsing sinus congestion/pressure in the maxillary region. 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 She notes people are always intermittently sick at work.   She took Tylenol Cold AM and PM and Motrin with some improvement in her symptoms, but she didn't take anything this morning.    Past Medical History  Diagnosis Date  . Medical history non-contributory    Past Surgical History  Procedure Laterality Date  . Tubal ligation    . Hand surgery    . Knee surgery     No family history on file. Social History  Substance Use Topics  . Smoking status: Current Every Day Smoker -- 0.00 packs/day for 20 years    Types: Cigarettes  . Smokeless tobacco: None  . Alcohol Use: Yes     Comment: occasional   OB History    Gravida Para Term Preterm AB TAB SAB Ectopic Multiple Living   Review of Systems  Constitutional: Positive for chills and fatigue. Negative for fever, diaphoresis, activity change and appetite change.  HENT: Positive for congestion, postnasal drip, rhinorrhea, sinus pressure, sneezing and sore throat. Negative for drooling, ear discharge, ear pain, facial swelling, mouth sores, tinnitus and trouble swallowing.   Eyes: Positive for itching. Negative for photophobia, pain, discharge, redness and  visual disturbance.  Respiratory: Positive for cough and chest tightness. Negative for apnea, choking, shortness of breath, wheezing and stridor.   Cardiovascular: Positive for chest pain. Negative for palpitations.  Gastrointestinal: Positive for diarrhea. Negative for nausea, vomiting and abdominal pain.  Endocrine: Negative for polyuria.  Genitourinary: Negative.   Musculoskeletal: Positive for myalgias. Negative for arthralgias.  Skin: Negative for rash.  Allergic/Immunologic: Negative for immunocompromised state.  Neurological: Positive for headaches. Negative for dizziness and light-headedness.  Hematological: Negative for adenopathy.  Psychiatric/Behavioral: Negative for behavioral problems and confusion.      Allergies  Bee venom  Home Medications   Prior to Admission medications   Medication Sig Start Date End Date Taking? Authorizing Provider  fluticasone (FLONASE) 50 MCG/ACT nasal spray Place 2 sprays into both nostrils daily. 07/16/15   Joanna Puff, MD  guaiFENesin (MUCINEX) 600 MG 12 hr tablet Take 1 tablet (600 mg total) by mouth 2 (two) times daily. 07/16/15   Joanna Puff, MD   BP 103/67 mmHg  Pulse 73  Temp(Src) 98.5 F (36.9 C) (Oral)  Resp 16  SpO2 99% Physical Exam  Constitutional: She is oriented to person, place, and time. She appears well-developed. No distress.  Intermittently coughing  HENT:  Head: Normocephalic.  Right Ear: External ear normal.  Left Ear: External ear normal.  Mouth/Throat: Oropharynx is clear and moist. No oropharyngeal exudate.  Clear nasal drainage  Eyes: Conjunctivae are normal. Right eye exhibits no discharge. Left eye exhibits no discharge.  No scleral icterus.  Neck: Normal range of motion.  Cardiovascular: Normal rate, regular rhythm and normal heart sounds.  Exam reveals no gallop and no friction rub.   No murmur heard. Pulmonary/Chest: Effort normal and breath sounds normal. No respiratory distress. She has no  wheezes. She has no rales. She exhibits no tenderness.  Abdominal: Soft. Bowel sounds are normal. She exhibits no distension. There is no tenderness. There is no rebound and no guarding.  Musculoskeletal: Normal range of motion. She exhibits no edema.  Lymphadenopathy:    She has no cervical adenopathy.  Neurological: She is alert and oriented to person, place, and time. No cranial nerve deficit.  Skin: Skin is warm and dry. No rash noted. She is not diaphoretic.  Psychiatric: She has a normal mood and affect.    ED Course  Procedures (including critical care time) Labs Review Labs Reviewed  CBC WITH DIFFERENTIAL/PLATELET - Abnormal; Notable for the following:    WBC 10.9 (*)    Hemoglobin 11.6 (*)    HCT 35.5 (*)    MCV 77.7 (*)    MCH 25.4 (*)    Monocytes Absolute 1.1 (*)    All other components within normal limits  BASIC METABOLIC PANEL    Imaging Review Dg Chest 2 View  07/16/2015  CLINICAL DATA:  Cough since Saturday EXAM: CHEST  2 VIEW COMPARISON:  11/09/2014 FINDINGS: The heart size and mediastinal contours are within normal limits. Both lungs are clear. The visualized skeletal structures are unremarkable. IMPRESSION: No active cardiopulmonary disease. Electronically Signed   By: Elige KoHetal  Patel   On: 07/16/2015 10:27   I have personally reviewed and evaluated these images and lab results as part of my medical decision-making.   EKG Interpretation   Date/Time:  Monday July 16 2015 09:31:56 EDT Ventricular Rate:  81 PR Interval:  130 QRS Duration: 76 QT Interval:  362 QTC Calculation: 420 R Axis:   61 Text Interpretation:  Normal sinus rhythm Normal ECG No significant change  since last tracing Confirmed by KNOTT MD, DANIEL ([id number]) on 07/16/2015  11:04:31 AM      MDM   Final diagnoses:  Viral URI with cough    This is a present healthy 34 or below presented with symptoms consistent with a viral infection. She is nontoxic on exam, breathing comfortably, and chest  x-ray is clear.  Her chest pain is all associated with coughing, and she has a normal EKG. Discussed the clinical course for viral infections, enzymatic treatment. Prescribed Flonase and Mucinex to help her symptoms, advised to continue with Motrin as needed for myalgias.  Discussed return to work with a mask on. His has return precautions. Patient stable for discharge, and is in agreement with this plan.  Joanna Puffrystal S. Dorsey, MD [facility] Resident  07/16/2015, 12:07 PM     Joanna Puffrystal S Dorsey, MD 07/16/15 [id number]  Lyndal Pulleyaniel Knott, MD 07/17/15 [phone]

## 2015-07-16 NOTE — ED Notes (Signed)
Patient transported to X-ray 

## 2015-07-16 NOTE — ED Notes (Signed)
Patient undressed, in gown, on monitor, continuous pulse oximetry and blood pressure cuff; warm blanket given 

## 2017-01-02 IMAGING — DX DG CHEST 2V
2 series · 2 of 2 positions shown · non-contrast
Comparison: 11/09/2014

CLINICAL DATA: Cough since [REDACTED]

EXAM:
CHEST  2 VIEW

[chest pa]
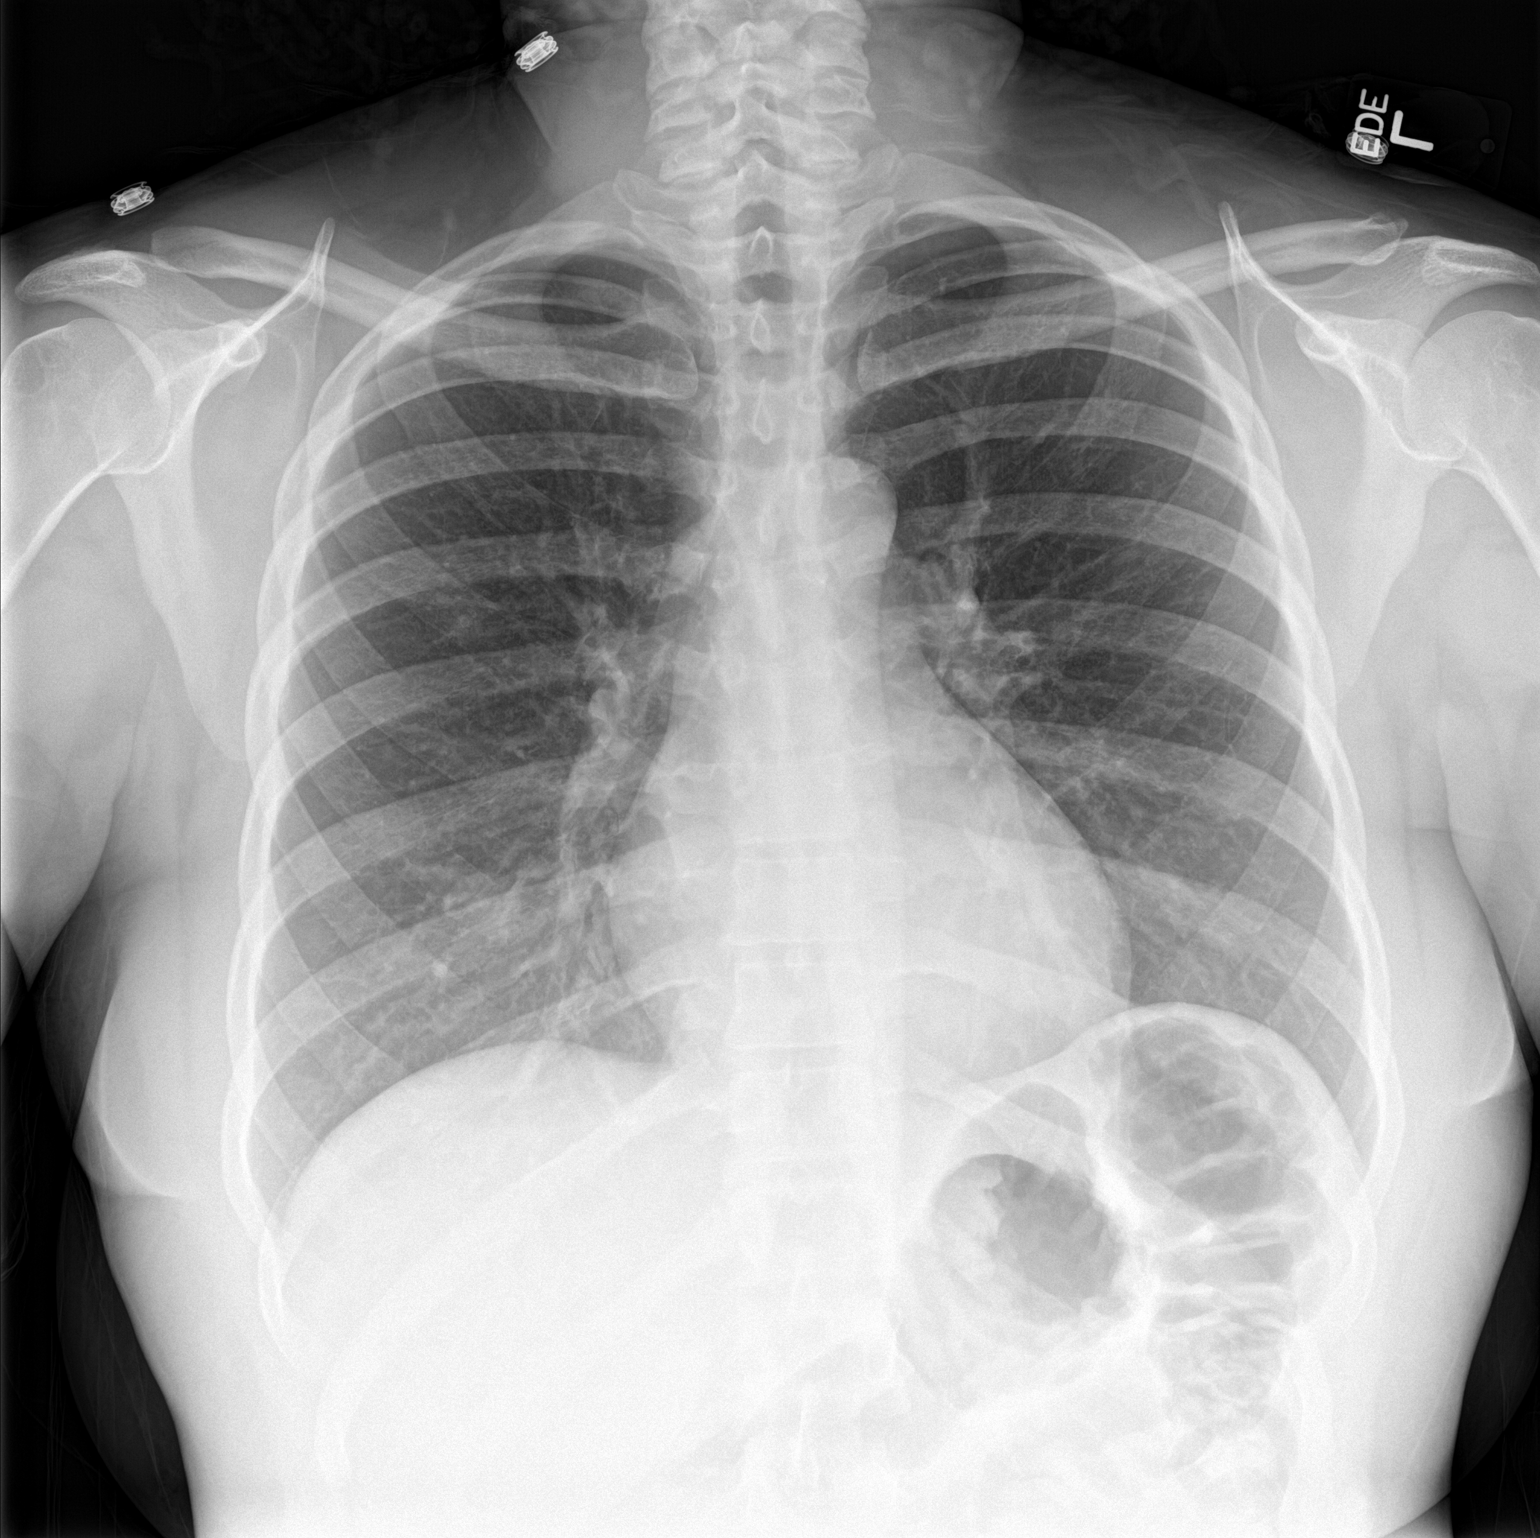

[chest lat]
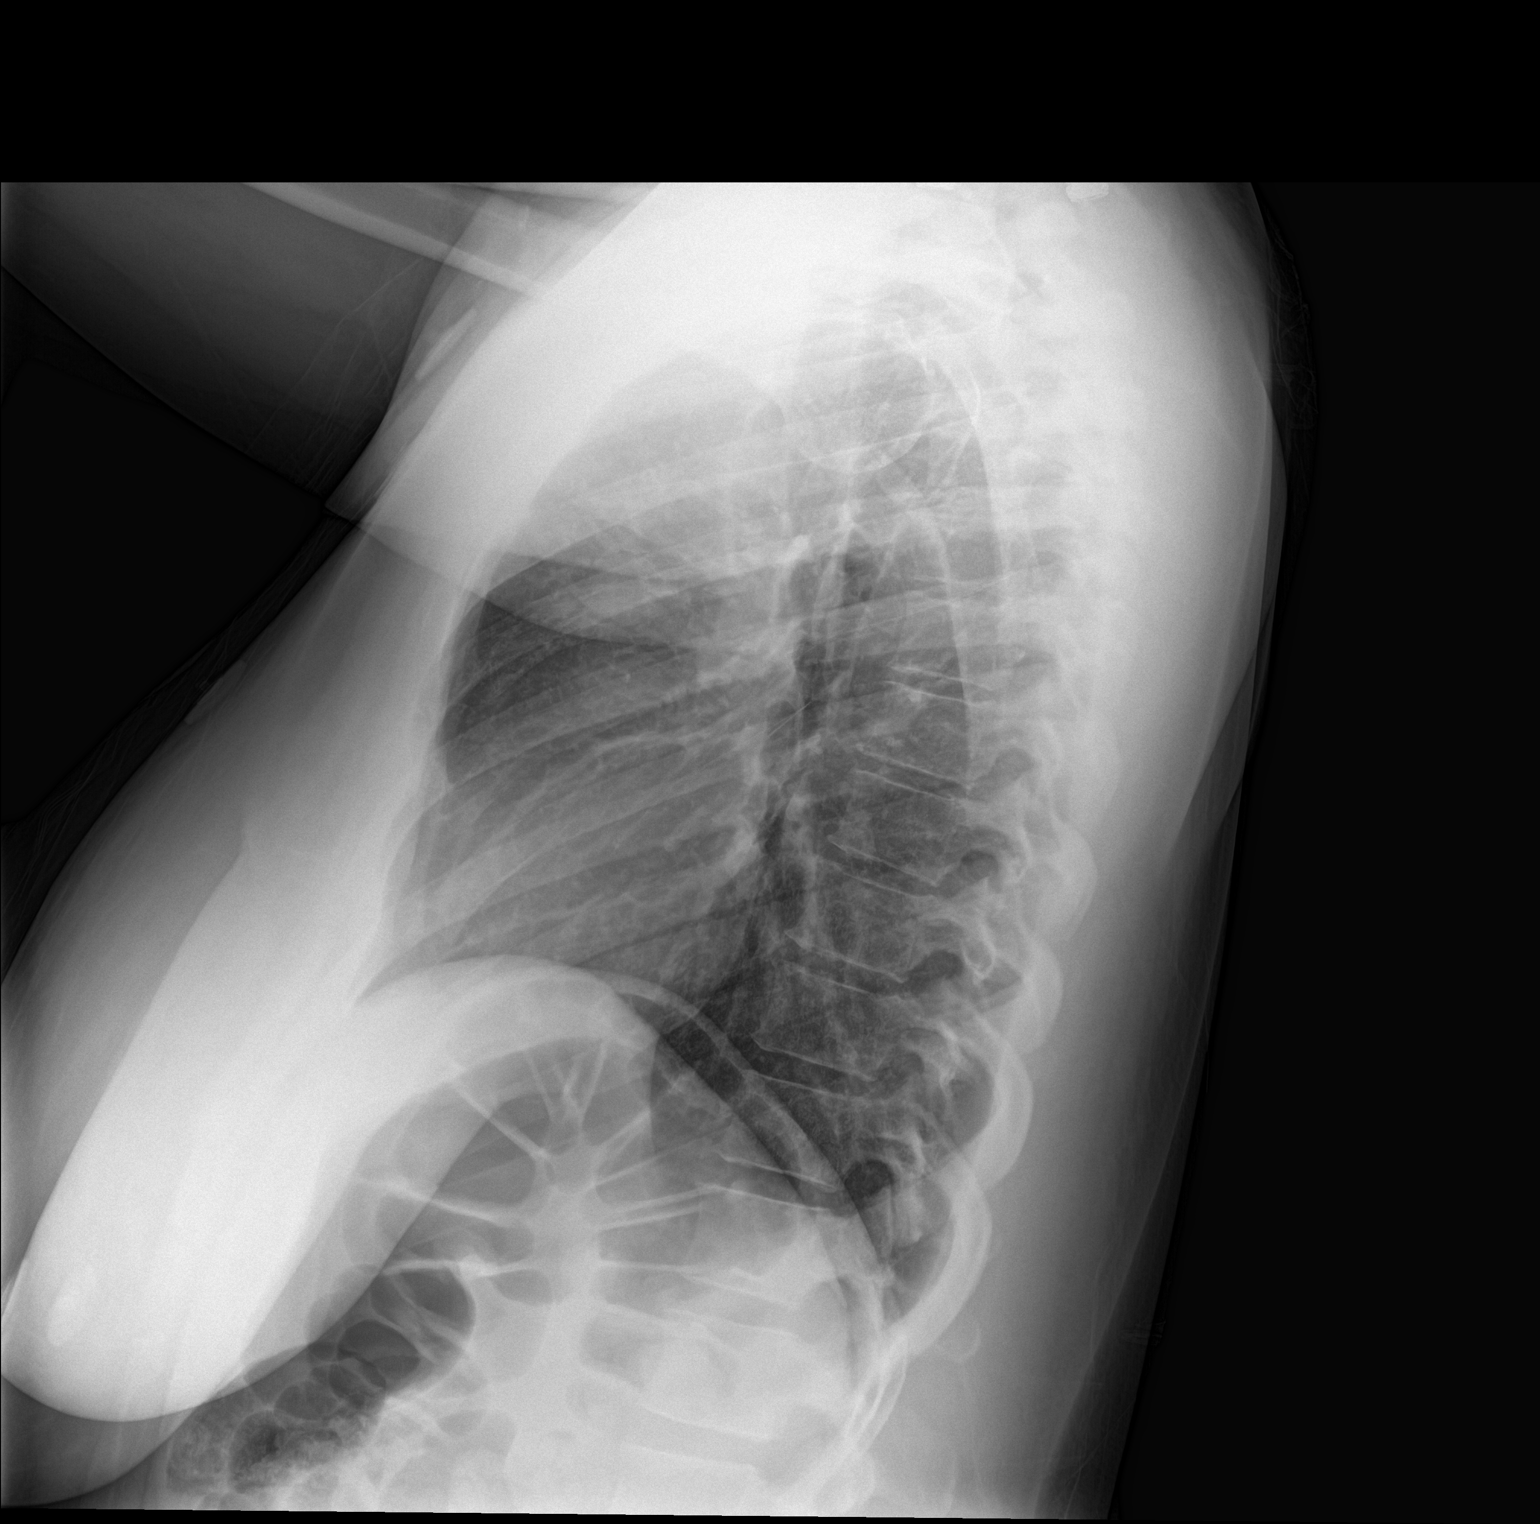

[2 of 2 positions shown; findings below may reference images not displayed]

FINDINGS: The heart size and mediastinal contours are within normal limits.
Both lungs are clear. The visualized skeletal structures are
unremarkable.
IMPRESSION: No active cardiopulmonary disease.
